# Patient Record
Sex: Female | Born: 1955 | Race: White | Hispanic: No | Marital: Married | Smoking: Former smoker
Health system: Southern US, Community
[De-identification: ages and names within clinical notes are randomized; demographics above are authoritative.]

## PROBLEM LIST (undated history)

## (undated) DIAGNOSIS — Z973 Presence of spectacles and contact lenses: Secondary | ICD-10-CM

## (undated) DIAGNOSIS — Z8249 Family history of ischemic heart disease and other diseases of the circulatory system: Secondary | ICD-10-CM

## (undated) DIAGNOSIS — E559 Vitamin D deficiency, unspecified: Secondary | ICD-10-CM

## (undated) DIAGNOSIS — R1310 Dysphagia, unspecified: Secondary | ICD-10-CM

## (undated) DIAGNOSIS — Z6826 Body mass index (BMI) 26.0-26.9, adult: Secondary | ICD-10-CM

## (undated) DIAGNOSIS — M1990 Unspecified osteoarthritis, unspecified site: Secondary | ICD-10-CM

## (undated) DIAGNOSIS — M797 Fibromyalgia: Secondary | ICD-10-CM

## (undated) DIAGNOSIS — G4700 Insomnia, unspecified: Secondary | ICD-10-CM

## (undated) DIAGNOSIS — R42 Dizziness and giddiness: Secondary | ICD-10-CM

## (undated) DIAGNOSIS — E785 Hyperlipidemia, unspecified: Secondary | ICD-10-CM

## (undated) DIAGNOSIS — R0981 Nasal congestion: Secondary | ICD-10-CM

## (undated) DIAGNOSIS — Z853 Personal history of malignant neoplasm of breast: Secondary | ICD-10-CM

## (undated) DIAGNOSIS — E7800 Pure hypercholesterolemia, unspecified: Secondary | ICD-10-CM

## (undated) DIAGNOSIS — R5383 Other fatigue: Secondary | ICD-10-CM

## (undated) DIAGNOSIS — R413 Other amnesia: Secondary | ICD-10-CM

## (undated) DIAGNOSIS — C801 Malignant (primary) neoplasm, unspecified: Secondary | ICD-10-CM

## (undated) HISTORY — DX: Nasal congestion: R09.81

## (undated) HISTORY — DX: Family history of ischemic heart disease and other diseases of the circulatory system: Z82.49

## (undated) HISTORY — PX: ABDOMINAL HYSTERECTOMY: SHX81

## (undated) HISTORY — DX: Hyperlipidemia, unspecified: E78.5

## (undated) HISTORY — DX: Dysphagia, unspecified: R13.10

## (undated) HISTORY — DX: Personal history of malignant neoplasm of breast: Z85.3

## (undated) HISTORY — PX: COLONOSCOPY: SHX174

## (undated) HISTORY — DX: Dizziness and giddiness: R42

## (undated) HISTORY — DX: Vitamin D deficiency, unspecified: E55.9

## (undated) HISTORY — DX: Unspecified osteoarthritis, unspecified site: M19.90

## (undated) HISTORY — DX: Pure hypercholesterolemia, unspecified: E78.00

## (undated) HISTORY — DX: Insomnia, unspecified: G47.00

## (undated) HISTORY — DX: Body mass index (BMI) 26.0-26.9, adult: Z68.26

## (undated) HISTORY — DX: Other amnesia: R41.3

## (undated) HISTORY — DX: Other fatigue: R53.83

---

## 1986-10-03 HISTORY — PX: ECTOPIC PREGNANCY SURGERY: SHX613

## 1998-03-08 ENCOUNTER — Other Ambulatory Visit: Admission: RE | Admit: 1998-03-08 | Discharge: 1998-03-08 | Payer: Self-pay | Admitting: Obstetrics and Gynecology

## 1999-03-09 ENCOUNTER — Other Ambulatory Visit: Admission: RE | Admit: 1999-03-09 | Discharge: 1999-03-09 | Payer: Self-pay | Admitting: Obstetrics and Gynecology

## 2005-03-04 DIAGNOSIS — Z853 Personal history of malignant neoplasm of breast: Secondary | ICD-10-CM

## 2005-03-04 HISTORY — DX: Personal history of malignant neoplasm of breast: Z85.3

## 2005-10-02 HISTORY — PX: BREAST LUMPECTOMY: SHX2

## 2005-10-18 ENCOUNTER — Encounter: Admission: RE | Admit: 2005-10-18 | Discharge: 2005-10-18 | Payer: Self-pay | Admitting: Unknown Physician Specialty

## 2005-10-18 ENCOUNTER — Encounter (INDEPENDENT_AMBULATORY_CARE_PROVIDER_SITE_OTHER): Payer: Self-pay | Admitting: Specialist

## 2005-10-18 ENCOUNTER — Encounter (INDEPENDENT_AMBULATORY_CARE_PROVIDER_SITE_OTHER): Payer: Self-pay | Admitting: Diagnostic Radiology

## 2005-10-27 ENCOUNTER — Encounter: Admission: RE | Admit: 2005-10-27 | Discharge: 2005-10-27 | Payer: Self-pay | Admitting: Unknown Physician Specialty

## 2005-10-29 ENCOUNTER — Encounter: Admission: RE | Admit: 2005-10-29 | Discharge: 2005-10-29 | Payer: Self-pay | Admitting: General Surgery

## 2005-10-30 ENCOUNTER — Encounter (INDEPENDENT_AMBULATORY_CARE_PROVIDER_SITE_OTHER): Payer: Self-pay | Admitting: Specialist

## 2005-10-30 ENCOUNTER — Encounter: Admission: RE | Admit: 2005-10-30 | Discharge: 2005-10-30 | Payer: Self-pay | Admitting: General Surgery

## 2005-10-30 ENCOUNTER — Ambulatory Visit (HOSPITAL_BASED_OUTPATIENT_CLINIC_OR_DEPARTMENT_OTHER): Admission: RE | Admit: 2005-10-30 | Discharge: 2005-10-30 | Payer: Self-pay | Admitting: General Surgery

## 2005-11-26 ENCOUNTER — Ambulatory Visit: Admission: RE | Admit: 2005-11-26 | Discharge: 2006-01-30 | Payer: Self-pay | Admitting: Radiation Oncology

## 2007-02-06 IMAGING — CR DG CHEST 2V
2 series · 2 of 2 positions shown · non-contrast
Comparison: none

CLINICAL DATA: Pre-op for right breast carcinoma surgery.
 CHEST ? 2 VIEW:

[w chest pa]
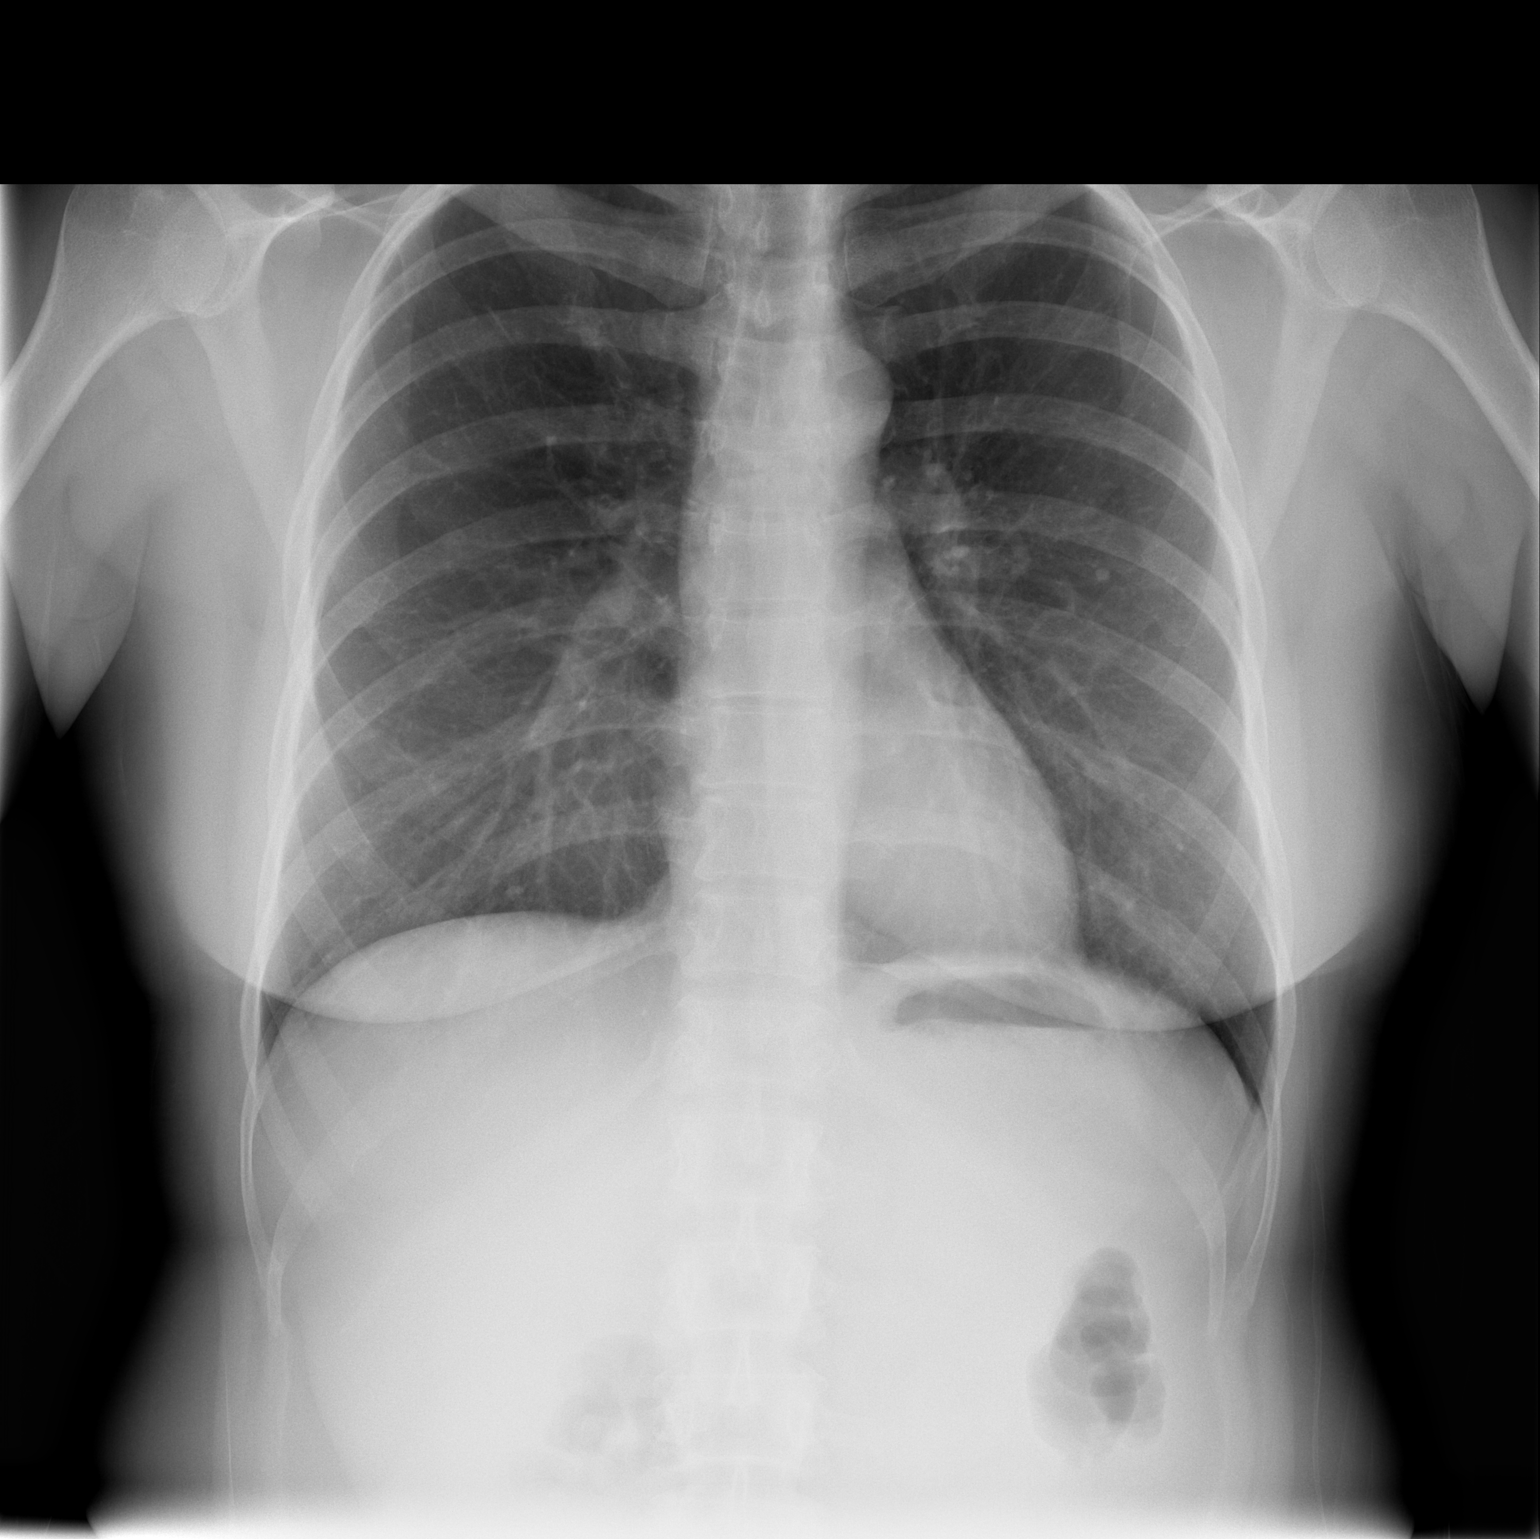

[w chest lat]
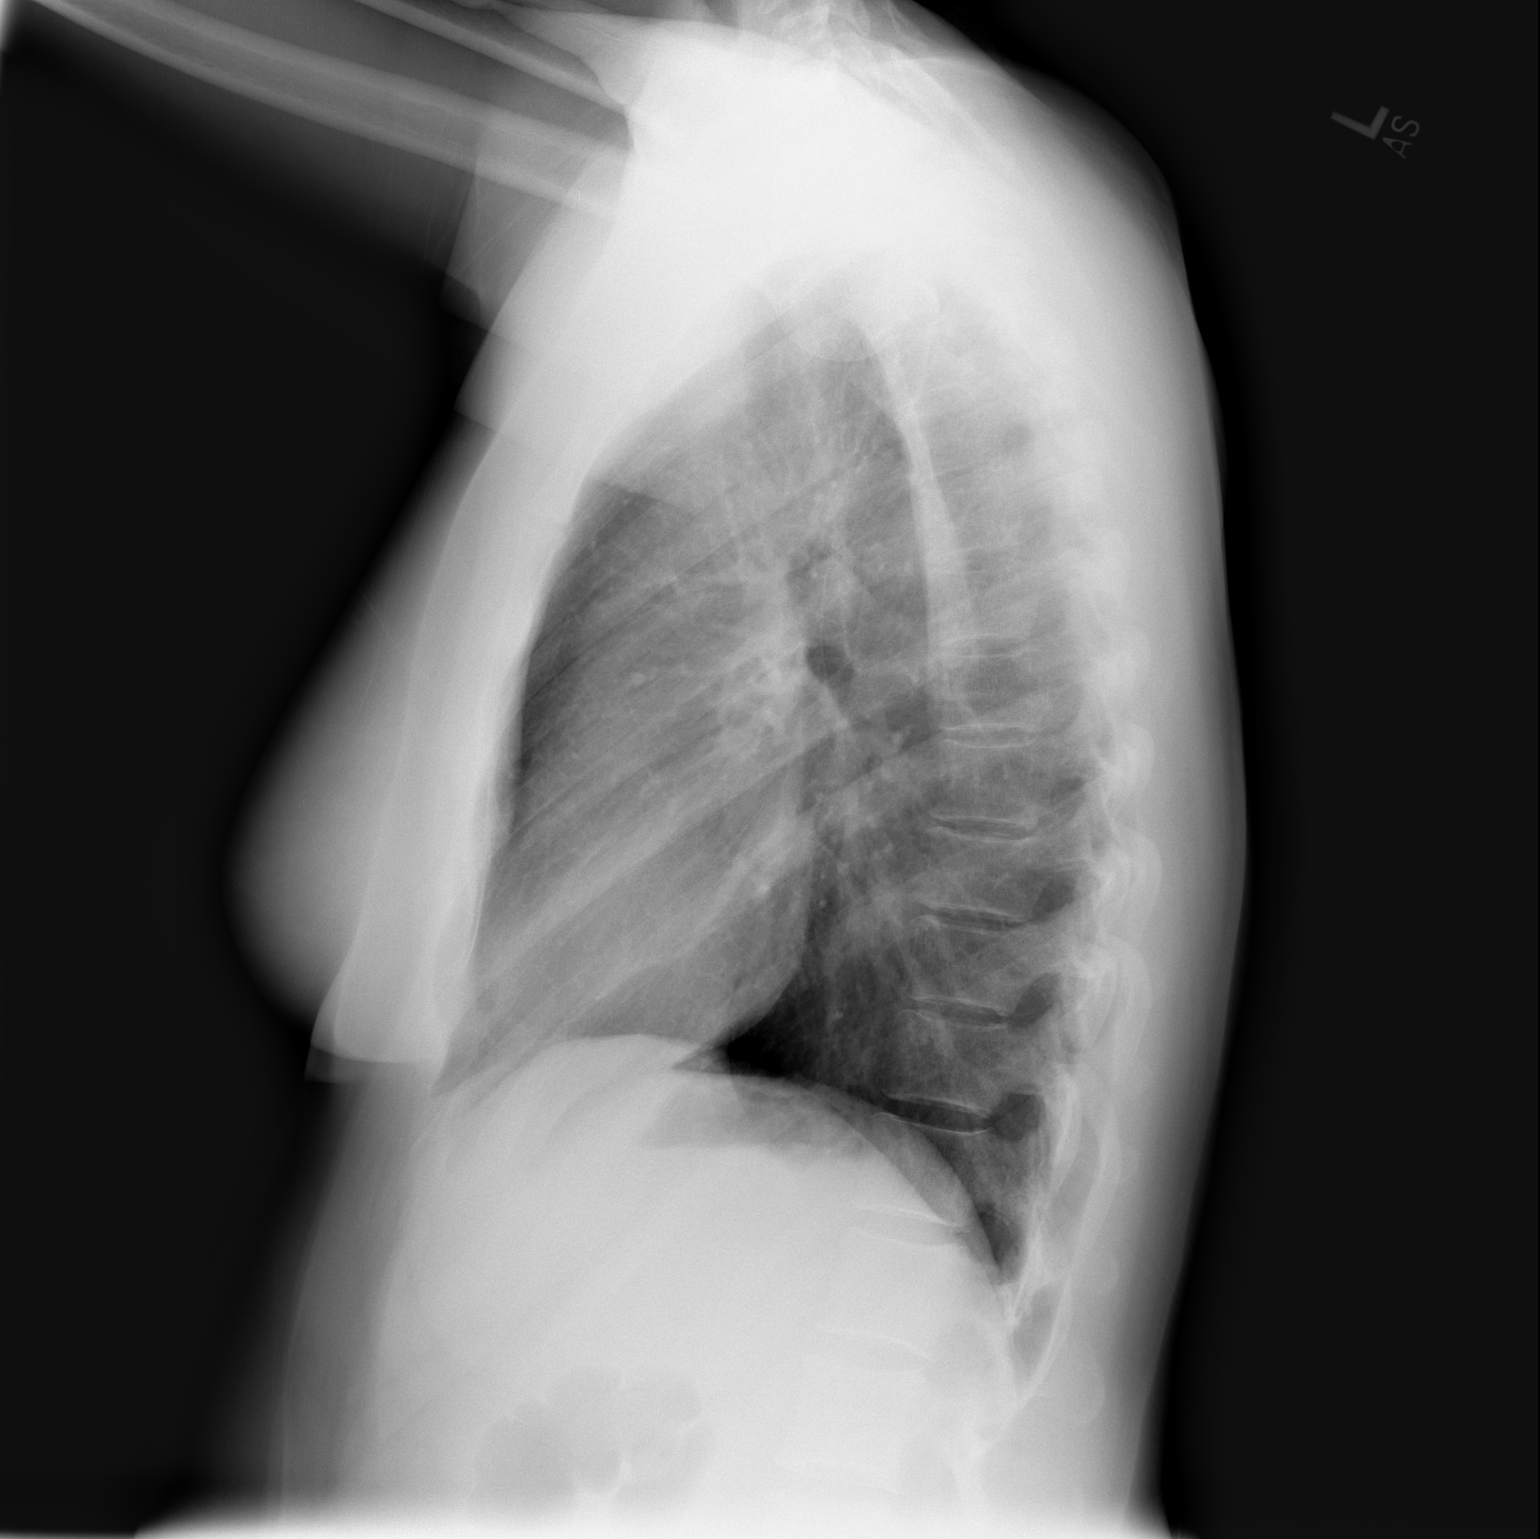

[2 of 2 positions shown; findings below may reference images not displayed]

FINDINGS: Two views of the chest show no active infiltrate or effusion.  There is a probable small granuloma within the anterior left upper lobe.  The heart is within normal limits in size.  No bony abnormality is seen.
IMPRESSION: No active lung disease.

## 2007-11-15 ENCOUNTER — Encounter: Admission: RE | Admit: 2007-11-15 | Discharge: 2007-11-15 | Payer: Self-pay | Admitting: Hematology and Oncology

## 2008-10-02 HISTORY — PX: LAPAROSCOPIC CHOLECYSTECTOMY: SUR755

## 2010-07-20 NOTE — Op Note (Signed)
NAME:  Alexandra Villa, Alexandra Villa               ACCOUNT NO.:  [id number]   MEDICAL RECORD NO.:  [id number]          PATIENT TYPE:  AMB   LOCATION:  DSC                          FACILITY:  [facility]   PHYSICIAN:  Rose Phi. Maple Hudson, M.D.   DATE OF BIRTH:  [birth year]   DATE OF PROCEDURE:  10/30/2005  DATE OF DISCHARGE:                                 OPERATIVE REPORT   PREOPERATIVE DIAGNOSIS:  Stage I carcinoma of the right breast.   POSTOPERATIVE DIAGNOSIS:  Stage I carcinoma of the right breast.   OPERATIONS:  1. Blue dye injection.  2. Right partial mastectomy with needle localization and specimen      mammogram.  3. Right sentinel lymph node biopsy.   SURGEON:  Dr. Francina Ames.   ANESTHESIA:  General.   OPERATIVE PROCEDURE:  Prior to coming the operating room, a localizing wire  had been placed in the right breast at the location of the tumor at about  the 11:30 position, and 1 millicurie of technetium sulfa colloid was  injected intradermally.   After suitable general anesthesia was induced, the patient was placed in the  supine position with the arms extended on the arm board.  Then 5 cc of a  mixture of 2 cc of methylene blue and 3 cc of injectable saline was injected  in the subareolar tissue and the breast gently massaged.  We then prepped  and draped in the standard fashion.   The localizing wire was at about the 11:30 position and a curved incision  was then outlined using that as a marker.  The incision was made and a wide  excision of the wire and surrounding tissue was carried out.  Specimen was  submitted then to the radiologist for a specimen mammogram and then to the  pathologist for evaluation of the margins.   While that was being done, a short transverse right axillary incision was  made with dissection through the subcutaneous tissue to the clavipectoral  fascia.  Deep to the fascia was a cluster of two hot and bluish lymph nodes  which we excised as sentinel nodes and  submitted those to the pathologist.  There were no other blue palpable or hot nodes.   Both incisions were then closed after injecting with 0.25% Marcaine.  We  then awaited the reports.  The specimen mammogram confirmed the removal of a  lesion.  Evaluation of the margins was considered negative, and the sentinel  node was also negative.  After applying Steri-Strips to the incisions,  dressings were then applied and the patient transferred to the recovery room  in satisfactory condition, having tolerated the procedure well.      Rose Phi. Maple Hudson, M.D.  Electronically Signed     PRY/MEDQ  D:  10/30/2005  T:  10/30/2005  Job:  [id number]

## 2011-05-20 ENCOUNTER — Encounter (INDEPENDENT_AMBULATORY_CARE_PROVIDER_SITE_OTHER): Payer: Self-pay | Admitting: General Surgery

## 2011-07-17 ENCOUNTER — Ambulatory Visit (INDEPENDENT_AMBULATORY_CARE_PROVIDER_SITE_OTHER): Payer: Self-pay | Admitting: General Surgery

## 2011-07-18 ENCOUNTER — Encounter (INDEPENDENT_AMBULATORY_CARE_PROVIDER_SITE_OTHER): Payer: Self-pay | Admitting: General Surgery

## 2011-07-18 ENCOUNTER — Ambulatory Visit (INDEPENDENT_AMBULATORY_CARE_PROVIDER_SITE_OTHER): Payer: BC Managed Care – PPO | Admitting: General Surgery

## 2011-07-18 VITALS — BP 122/90 | HR 72 | Temp 96.8°F | Resp 16 | Ht 62.5 in | Wt 145.0 lb

## 2011-07-18 DIAGNOSIS — Z853 Personal history of malignant neoplasm of breast: Secondary | ICD-10-CM

## 2011-07-18 NOTE — Progress Notes (Signed)
Chief complaint: Followup breast cancer History: Patient returns to the office for routine followup for breast cancer with a history of lumpectomy, negative sentinel lymph node biopsy, radiation therapy, and 4 years of adjuvant tamoxifen for T1 B. N0 carcinoma right breast. She is over 5 years post treatment. She has some chronic aches and pains secondary to fibromyalgia. No change in her breast self exam. She has some occasional twinges of pain in her right arm that are stable. No arm swelling. No skin changes or nipple discharge. She had her mammogram in August that she reports as negative although we do not have a copy.  Exam: General: Appears well Lymph nodes: No cervical, supraclavicular, or axillary nodes palpable Breasts: Well-healed lumpectomy site in the upper right breast. There is a very slight thickening beneath the axillary incision and beneath the lumpectomy site but no mass. No skin changes. No nipple inversion. Left breast is negative.  Assessment and plan: Doing well 5 years following treatment for right breast cancer with no evidence of recurrent or new cancer. I encouraged to continue yearly screening mammograms and once a year physical exam with Dr. Lawana Pai for her primary physician and we will be available as needed.

## 2011-07-26 ENCOUNTER — Ambulatory Visit (INDEPENDENT_AMBULATORY_CARE_PROVIDER_SITE_OTHER): Payer: Self-pay | Admitting: General Surgery

## 2011-08-08 ENCOUNTER — Telehealth (INDEPENDENT_AMBULATORY_CARE_PROVIDER_SITE_OTHER): Payer: Self-pay

## 2011-08-08 NOTE — Telephone Encounter (Signed)
Request for 2012-2013 records faxed to [facility] [phone].

## 2011-10-01 ENCOUNTER — Encounter: Payer: Self-pay | Admitting: Hematology and Oncology

## 2011-10-01 DIAGNOSIS — N63 Unspecified lump in unspecified breast: Secondary | ICD-10-CM

## 2011-10-01 DIAGNOSIS — Z853 Personal history of malignant neoplasm of breast: Secondary | ICD-10-CM

## 2011-10-14 ENCOUNTER — Encounter (INDEPENDENT_AMBULATORY_CARE_PROVIDER_SITE_OTHER): Payer: Self-pay

## 2011-10-21 DIAGNOSIS — C50919 Malignant neoplasm of unspecified site of unspecified female breast: Secondary | ICD-10-CM

## 2012-07-22 ENCOUNTER — Ambulatory Visit (INDEPENDENT_AMBULATORY_CARE_PROVIDER_SITE_OTHER): Payer: BC Managed Care – PPO | Admitting: General Surgery

## 2012-10-05 ENCOUNTER — Encounter (INDEPENDENT_AMBULATORY_CARE_PROVIDER_SITE_OTHER): Payer: Self-pay

## 2012-10-29 ENCOUNTER — Encounter (INDEPENDENT_AMBULATORY_CARE_PROVIDER_SITE_OTHER): Payer: Self-pay | Admitting: General Surgery

## 2012-10-29 ENCOUNTER — Ambulatory Visit (INDEPENDENT_AMBULATORY_CARE_PROVIDER_SITE_OTHER): Payer: BC Managed Care – PPO | Admitting: General Surgery

## 2012-10-29 VITALS — BP 128/80 | HR 58 | Resp 16 | Ht 62.5 in | Wt 134.6 lb

## 2012-10-29 DIAGNOSIS — Z853 Personal history of malignant neoplasm of breast: Secondary | ICD-10-CM

## 2012-10-29 DIAGNOSIS — C50911 Malignant neoplasm of unspecified site of right female breast: Secondary | ICD-10-CM | POA: Insufficient documentation

## 2012-10-29 NOTE — Progress Notes (Signed)
Chief complaint: Followup breast cancer History: Patient returns to the office for  followup for breast cancer with a history of lumpectomy, negative sentinel lymph node biopsy, radiation therapy, and 4 years of adjuvant tamoxifen for T1 B. N0 carcinoma right breast. She is over 5 years post treatment.She is a little concerned about 2 small pigmented skin lesions she is noted on the lateral aspect of her right treated breast. No change in her breast self exam. She also has noted for about 2 months of change in her bowel habits with some intermittent constipation or frequent stools. No blood. No pain. Her last colonoscopy was 9 years ago.  Exam: General: Appears well Lymph nodes: No cervical, supraclavicular, or axillary nodes palpable Skin: On the lateral aspect of the right breast are 2 very small approximately 2 mm slightly raised discrete pigmented skin lesions Breasts: Well-healed lumpectomy site in the upper right breast. There is a very slight thickening beneath the axillary incision and beneath the lumpectomy site but no mass. No skin changes. No nipple inversion. Left breast is negative.  Assessment and plan: Doing well 5 years following treatment for right breast cancer with no evidence of recurrent or new cancer. The skin lesions on her breast appeared to be small benign keratoses and I recommend observation only. She does have some change in her bowel habits. I recommended initially fiber supplementation and since she has a personal history of breast cancer in her last colonoscopy was 9 years ago I recommended that she proceed with screening colonoscopy. She will contact her primary physician and he didn't have this arranged.

## 2012-10-29 NOTE — Patient Instructions (Addendum)
Take a fiber supplement daily such as Metamucil or FiberCon or Surfak or others Would go ahead and have a colonoscopy

## 2013-01-07 ENCOUNTER — Other Ambulatory Visit: Payer: Self-pay

## 2013-01-08 ENCOUNTER — Encounter (INDEPENDENT_AMBULATORY_CARE_PROVIDER_SITE_OTHER): Payer: Self-pay

## 2013-01-25 ENCOUNTER — Encounter (INDEPENDENT_AMBULATORY_CARE_PROVIDER_SITE_OTHER): Payer: Self-pay | Admitting: General Surgery

## 2013-02-11 ENCOUNTER — Encounter (INDEPENDENT_AMBULATORY_CARE_PROVIDER_SITE_OTHER): Payer: Self-pay

## 2013-03-05 ENCOUNTER — Encounter (INDEPENDENT_AMBULATORY_CARE_PROVIDER_SITE_OTHER): Payer: BC Managed Care – PPO | Admitting: General Surgery

## 2013-03-08 ENCOUNTER — Encounter (INDEPENDENT_AMBULATORY_CARE_PROVIDER_SITE_OTHER): Payer: Self-pay

## 2013-03-09 ENCOUNTER — Encounter (INDEPENDENT_AMBULATORY_CARE_PROVIDER_SITE_OTHER): Payer: Self-pay

## 2014-03-17 ENCOUNTER — Other Ambulatory Visit: Payer: Self-pay | Admitting: Orthopedic Surgery

## 2014-04-12 ENCOUNTER — Encounter (HOSPITAL_BASED_OUTPATIENT_CLINIC_OR_DEPARTMENT_OTHER): Payer: Self-pay | Admitting: *Deleted

## 2014-04-12 NOTE — Progress Notes (Signed)
No labs needed

## 2014-04-14 ENCOUNTER — Ambulatory Visit (HOSPITAL_BASED_OUTPATIENT_CLINIC_OR_DEPARTMENT_OTHER)
Admission: RE | Admit: 2014-04-14 | Discharge: 2014-04-14 | Disposition: A | Discharge status: Home / Self Care | Payer: BLUE CROSS/BLUE SHIELD | Source: Ambulatory Visit | Attending: Orthopedic Surgery | Admitting: Orthopedic Surgery

## 2014-04-14 ENCOUNTER — Encounter (HOSPITAL_BASED_OUTPATIENT_CLINIC_OR_DEPARTMENT_OTHER): Payer: Self-pay | Admitting: Anesthesiology

## 2014-04-14 ENCOUNTER — Ambulatory Visit (HOSPITAL_BASED_OUTPATIENT_CLINIC_OR_DEPARTMENT_OTHER): Payer: BLUE CROSS/BLUE SHIELD | Admitting: Anesthesiology

## 2014-04-14 ENCOUNTER — Encounter (HOSPITAL_BASED_OUTPATIENT_CLINIC_OR_DEPARTMENT_OTHER)
Admission: RE | Disposition: A | Discharge status: Home / Self Care | Payer: Self-pay | Source: Ambulatory Visit | Attending: Orthopedic Surgery

## 2014-04-14 DIAGNOSIS — Z791 Long term (current) use of non-steroidal anti-inflammatories (NSAID): Secondary | ICD-10-CM | POA: Diagnosis not present

## 2014-04-14 DIAGNOSIS — G5601 Carpal tunnel syndrome, right upper limb: Secondary | ICD-10-CM | POA: Insufficient documentation

## 2014-04-14 DIAGNOSIS — Z87891 Personal history of nicotine dependence: Secondary | ICD-10-CM | POA: Diagnosis not present

## 2014-04-14 DIAGNOSIS — Z8261 Family history of arthritis: Secondary | ICD-10-CM | POA: Diagnosis not present

## 2014-04-14 DIAGNOSIS — R200 Anesthesia of skin: Secondary | ICD-10-CM | POA: Diagnosis present

## 2014-04-14 DIAGNOSIS — M1990 Unspecified osteoarthritis, unspecified site: Secondary | ICD-10-CM | POA: Insufficient documentation

## 2014-04-14 DIAGNOSIS — Z853 Personal history of malignant neoplasm of breast: Secondary | ICD-10-CM | POA: Insufficient documentation

## 2014-04-14 DIAGNOSIS — Z79899 Other long term (current) drug therapy: Secondary | ICD-10-CM | POA: Insufficient documentation

## 2014-04-14 DIAGNOSIS — G5602 Carpal tunnel syndrome, left upper limb: Secondary | ICD-10-CM | POA: Diagnosis not present

## 2014-04-14 HISTORY — PX: CARPAL TUNNEL RELEASE: SHX101

## 2014-04-14 HISTORY — DX: Presence of spectacles and contact lenses: Z97.3

## 2014-04-14 LAB — POCT HEMOGLOBIN-HEMACUE: Hemoglobin: 14.2 g/dL (ref 12.0–15.0)

## 2014-04-14 SURGERY — CARPAL TUNNEL RELEASE
Anesthesia: Monitor Anesthesia Care | Site: Wrist | Laterality: Right

## 2014-04-14 MED ORDER — DEXTROSE 50 % IV SOLN: 25.0000 mL | Freq: Once | INTRAVENOUS | Status: DC

## 2014-04-14 MED ORDER — CHLORHEXIDINE GLUCONATE 4 % EX LIQD
60.0000 mL | Freq: Once | CUTANEOUS | Status: DC
Start: 2014-04-14 — End: 2014-04-14

## 2014-04-14 MED ORDER — LIDOCAINE HCL (PF) 0.5 % IJ SOLN
INTRAMUSCULAR | Status: DC | PRN
  Administered 2014-04-14: 30 mL via INTRAVENOUS

## 2014-04-14 MED ORDER — CHLORHEXIDINE GLUCONATE 4 % EX LIQD: 60.0000 mL | Freq: Once | CUTANEOUS | Status: DC

## 2014-04-14 MED ORDER — PROPOFOL 10 MG/ML IV BOLUS
INTRAVENOUS | Status: DC | PRN
  Administered 2014-04-14: 20 mg via INTRAVENOUS

## 2014-04-14 MED ORDER — CEFAZOLIN SODIUM-DEXTROSE 2-3 GM-% IV SOLR: 2.0000 g | INTRAVENOUS | Status: DC

## 2014-04-14 MED ORDER — FENTANYL CITRATE 0.05 MG/ML IJ SOLN
INTRAMUSCULAR | Status: DC | PRN
  Administered 2014-04-14: 100 ug via INTRAVENOUS

## 2014-04-14 MED ORDER — BUPIVACAINE HCL (PF) 0.25 % IJ SOLN
INTRAMUSCULAR | Status: AC
  Filled 2014-04-14: qty 150

## 2014-04-14 MED ORDER — CEFAZOLIN SODIUM-DEXTROSE 2-3 GM-% IV SOLR
INTRAVENOUS | Status: AC
  Filled 2014-04-14: qty 50

## 2014-04-14 MED ORDER — CEFAZOLIN SODIUM-DEXTROSE 2-3 GM-% IV SOLR
2.0000 g | INTRAVENOUS | Status: AC
  Administered 2014-04-14: 2 g via INTRAVENOUS

## 2014-04-14 MED ORDER — MIDAZOLAM HCL 5 MG/5ML IJ SOLN
INTRAMUSCULAR | Status: DC | PRN
  Administered 2014-04-14: 2 mg via INTRAVENOUS

## 2014-04-14 MED ORDER — MIDAZOLAM HCL 2 MG/2ML IJ SOLN: 1.0000 mg | INTRAMUSCULAR | Status: DC | PRN

## 2014-04-14 MED ORDER — LIDOCAINE HCL (PF) 1 % IJ SOLN
INTRAMUSCULAR | Status: AC
  Filled 2014-04-14: qty 90

## 2014-04-14 MED ORDER — LACTATED RINGERS IV SOLN
INTRAVENOUS | Status: DC
  Administered 2014-04-14: 08:00:00 via INTRAVENOUS

## 2014-04-14 MED ORDER — PROPOFOL INFUSION 10 MG/ML OPTIME
INTRAVENOUS | Status: DC | PRN
  Administered 2014-04-14: 100 ug/kg/min via INTRAVENOUS

## 2014-04-14 MED ORDER — FENTANYL CITRATE 0.05 MG/ML IJ SOLN: 50.0000 ug | INTRAMUSCULAR | Status: DC | PRN

## 2014-04-14 MED ORDER — FENTANYL CITRATE 0.05 MG/ML IJ SOLN
INTRAMUSCULAR | Status: AC
  Filled 2014-04-14: qty 2

## 2014-04-14 MED ORDER — ONDANSETRON HCL 4 MG/2ML IJ SOLN
INTRAMUSCULAR | Status: DC | PRN
  Administered 2014-04-14: 4 mg via INTRAVENOUS

## 2014-04-14 MED ORDER — BUPIVACAINE HCL (PF) 0.25 % IJ SOLN
INTRAMUSCULAR | Status: DC | PRN
  Administered 2014-04-14: 5 mL

## 2014-04-14 MED ORDER — TRAMADOL HCL 50 MG PO TABS: 50.0000 mg | ORAL_TABLET | Freq: Four times a day (QID) | ORAL | Status: DC | PRN

## 2014-04-14 MED ORDER — MIDAZOLAM HCL 2 MG/2ML IJ SOLN
INTRAMUSCULAR | Status: AC
  Filled 2014-04-14: qty 2

## 2014-04-14 SURGICAL SUPPLY — 38 items
BLADE SURG 15 STRL LF DISP TIS (BLADE) ×1 IMPLANT
BLADE SURG 15 STRL SS (BLADE) ×3
BNDG COHESIVE 3X5 TAN STRL LF (GAUZE/BANDAGES/DRESSINGS) ×3 IMPLANT
BNDG ESMARK 4X9 LF (GAUZE/BANDAGES/DRESSINGS) ×3 IMPLANT
BNDG GAUZE ELAST 4 BULKY (GAUZE/BANDAGES/DRESSINGS) ×3 IMPLANT
CHLORAPREP W/TINT 26ML (MISCELLANEOUS) ×3 IMPLANT
CORDS BIPOLAR (ELECTRODE) ×3 IMPLANT
COVER BACK TABLE 60X90IN (DRAPES) ×3 IMPLANT
COVER MAYO STAND STRL (DRAPES) ×3 IMPLANT
CUFF TOURNIQUET SINGLE 18IN (TOURNIQUET CUFF) ×3 IMPLANT
DRAPE EXTREMITY T 121X128X90 (DRAPE) ×3 IMPLANT
DRAPE SURG 17X23 STRL (DRAPES) ×3 IMPLANT
DRSG PAD ABDOMINAL 8X10 ST (GAUZE/BANDAGES/DRESSINGS) ×3 IMPLANT
GAUZE SPONGE 4X4 12PLY STRL (GAUZE/BANDAGES/DRESSINGS) ×3 IMPLANT
GAUZE XEROFORM 1X8 LF (GAUZE/BANDAGES/DRESSINGS) ×3 IMPLANT
GLOVE BIOGEL M STRL SZ7.5 (GLOVE) ×3 IMPLANT
GLOVE BIOGEL PI IND STRL 7.5 (GLOVE) ×1 IMPLANT
GLOVE BIOGEL PI IND STRL 8 (GLOVE) ×1 IMPLANT
GLOVE BIOGEL PI IND STRL 8.5 (GLOVE) ×1 IMPLANT
GLOVE BIOGEL PI INDICATOR 7.5 (GLOVE) ×2
GLOVE BIOGEL PI INDICATOR 8 (GLOVE) ×2
GLOVE BIOGEL PI INDICATOR 8.5 (GLOVE) ×2
GLOVE SURG ORTHO 8.0 STRL STRW (GLOVE) ×3 IMPLANT
GLOVE SURG SS PI 7.0 STRL IVOR (GLOVE) ×3 IMPLANT
GOWN STRL REUS W/ TWL LRG LVL3 (GOWN DISPOSABLE) ×1 IMPLANT
GOWN STRL REUS W/ TWL XL LVL3 (GOWN DISPOSABLE) ×1 IMPLANT
GOWN STRL REUS W/TWL LRG LVL3 (GOWN DISPOSABLE) ×2
GOWN STRL REUS W/TWL XL LVL3 (GOWN DISPOSABLE) ×6 IMPLANT
NEEDLE PRECISIONGLIDE 27X1.5 (NEEDLE) ×3 IMPLANT
NS IRRIG 1000ML POUR BTL (IV SOLUTION) ×3 IMPLANT
PACK BASIN DAY SURGERY FS (CUSTOM PROCEDURE TRAY) ×3 IMPLANT
STOCKINETTE 4X48 STRL (DRAPES) ×3 IMPLANT
SUT ETHILON 4 0 PS 2 18 (SUTURE) ×3 IMPLANT
SUT VICRYL 4-0 PS2 18IN ABS (SUTURE) IMPLANT
SYR BULB 3OZ (MISCELLANEOUS) ×3 IMPLANT
SYR CONTROL 10ML LL (SYRINGE) ×3 IMPLANT
TOWEL OR 17X24 6PK STRL BLUE (TOWEL DISPOSABLE) ×3 IMPLANT
UNDERPAD 30X30 INCONTINENT (UNDERPADS AND DIAPERS) ×3 IMPLANT

## 2014-04-14 NOTE — Discharge Instructions (Addendum)
[facility] Instructions °Hand Surgery ° °Wound Care: °Keep your hand elevated above the level of your heart.  Do not allow it to dangle by your side.  Keep the dressing dry and do not remove it unless your doctor advises you to do so.  He will usually change it at the time of your post-op visit.  Moving your fingers is advised to stimulate circulation but will depend on the site of your surgery.  If you have a splint applied, your doctor will advise you regarding movement. ° °Activity: °Do not drive or operate machinery today.  Rest today and then you may return to your normal activity and work as indicated by your physician. ° °Diet:  °Drink liquids today or eat a light diet.  You may resume a regular diet tomorrow.   ° °General expectations: °Pain for two to three days. °Fingers may become slightly swollen. ° °Call your doctor if any of the following occur: °Severe pain not relieved by pain medication. °Elevated temperature. °Dressing soaked with blood. °Inability to move fingers. °Oyervides or bluish color to fingers. ° ° °Post Anesthesia Home Care Instructions ° °Activity: °Get plenty of rest for the remainder of the day. A responsible adult should stay with you for 24 hours following the procedure.  °For the next 24 hours, DO NOT: °-Drive a car °-Operate machinery °-Drink alcoholic beverages °-Take any medication unless instructed by your physician °-Make any legal decisions or sign important papers. ° °Meals: °Start with liquid foods such as gelatin or soup. Progress to regular foods as tolerated. Avoid greasy, spicy, heavy foods. If nausea and/or vomiting occur, drink only clear liquids until the nausea and/or vomiting subsides. Call your physician if vomiting continues. ° °Special Instructions/Symptoms: °Your throat may feel dry or sore from the anesthesia or the breathing tube placed in your throat during surgery. If this causes discomfort, gargle with warm salt water. The discomfort should disappear within 24  hours. ° °

## 2014-04-14 NOTE — Op Note (Signed)
[name], Alexandra Villa               ACCOUNT NO.:  [id number]  MEDICAL RECORD NO.:  [id number]  LOCATION:                                 FACILITY:  PHYSICIAN:  Daryll Brod, M.D.            DATE OF BIRTH:  DATE OF PROCEDURE:  04/14/2014 DATE OF DISCHARGE:                              OPERATIVE REPORT   PREOPERATIVE DIAGNOSIS:  Carpal tunnel syndrome, right hand.  POSTOPERATIVE DIAGNOSIS:  Carpal tunnel syndrome, right hand.  OPERATION:  Decompression of right median nerve.  SURGEON:  Daryll Brod, MD  ANESTHESIA:  Forearm-based IV regional with local infiltration.  ANESTHESIOLOGIST:  Finis Bud, M.D.  HISTORY:  The patient is a 59 year old female with a history of bilateral carpal tunnel syndrome.  Nerve conduction is positive.  She has elected to undergo surgical release with failure of conservative treatment.  Pre, peri, and postoperative course have been discussed along with risks and complications.  She is aware that there is no guarantee with surgery, possibility of infection; recurrence of injury to arteries, nerves, tendons, incomplete relief of symptoms; dystrophy. In preoperative area, the patient was seen, the extremity marked by both patient and surgeon.  Antibiotic given.  PROCEDURE IN DETAIL:  The patient was brought to the operating room, where a forearm-based IV regional anesthetic was carried out without difficulty.  She was prepped using ChloraPrep, supine position, right arm free.  A 3-minute dry time was allowed.  Time-out taken, confirming patient and procedure.  After adequate anesthesia was afforded, a longitudinal incision was made in the right palm, carried down through subcutaneous tissue.  Bleeders were electrocauterized with bipolar. Palmar fascia was split.  Superficial palmar arch identified.  Flexor tendon to the ring little finger identified to the ulnar side of median nerve.  Carpal retinaculum was incised with sharp dissection.   Right angle and Sewall retractor were placed between skin and forearm fascia. The fascia was released for approximately 2 cm proximal to the wrist crease under direct vision.  Canal was explored.  Thenar musculature was noted to enter into muscle.  Area of compression of the nerve was apparent with an hourglass deformity.  No further lesions were identified.  The wound was copiously irrigated with saline and the skin closed with interrupted 4-0 nylon sutures.  Local infiltration with 0.25% bupivacaine without epinephrine was given, approximately 6 mL was used.  A sterile compressive dressing with the fingers free was applied.  On deflation of the tourniquet, all fingers immediately pinked.  She was taken to the recovery room for observation in satisfactory condition.  She will be discharged home to return to the [facility] in 1 week on tramadol.          ______________________________ Daryll Brod, M.D.     GK/MEDQ  D:  04/14/2014  T:  04/14/2014  Job:  [id number]

## 2014-04-14 NOTE — Brief Op Note (Signed)
04/14/2014  9:09 AM  PATIENT:  Alexandra Villa  59 y.o. female  PRE-OPERATIVE DIAGNOSIS:  RIGHT CARPAL TUNNEL SYNDROME   POST-OPERATIVE DIAGNOSIS:  right carpal tunnel syndrome  PROCEDURE:  Procedure(s) with comments: RIGHT CARPAL TUNNEL RELEASE (Right) - ANESTHESIA: IV REGIONAL FOREARM BLOCK  SURGEON:  Surgeon(s) and Role:    * Daryll Brod, MD - Primary  PHYSICIAN ASSISTANT:   ASSISTANTS: none   ANESTHESIA:   local and regional  EBL:  Total I/O In: 600 [I.V.:600] Out: -   BLOOD ADMINISTERED:none  DRAINS: none   LOCAL MEDICATIONS USED:  BUPIVICAINE   SPECIMEN:  No Specimen  DISPOSITION OF SPECIMEN:  N/A  COUNTS:  YES  TOURNIQUET:   Total Tourniquet Time Documented: Forearm (Right) - 20 minutes Total: Forearm (Right) - 20 minutes   DICTATION: .Other Dictation: Dictation Number [id number]  PLAN OF CARE: Discharge to home after PACU  PATIENT DISPOSITION:  PACU - hemodynamically stable.

## 2014-04-14 NOTE — Transfer of Care (Signed)
Immediate Anesthesia Transfer of Care Note  Patient: Alexandra Villa  Procedure(s) Performed: Procedure(s) with comments: RIGHT CARPAL TUNNEL RELEASE (Right) - ANESTHESIA: IV REGIONAL FOREARM BLOCK  Patient Location: PACU  Anesthesia Type:MAC and Bier block  Level of Consciousness: awake, alert  and oriented  Airway & Oxygen Therapy: Patient Spontanous Breathing  Post-op Assessment: Report given to RN and Post -op Vital signs reviewed and stable  Post vital signs: Reviewed and stable  Last Vitals:  Filed Vitals:   04/14/14 0718  BP: 158/87  Pulse: 62  Temp: 36.5 C  Resp: 16    Complications: No apparent anesthesia complications

## 2014-04-14 NOTE — H&P (Signed)
Alexandra Villa is a 59 year old right hand dominant female referred by Dr. Rory Percy for a consultation with respect to numbness and tingling bilateral hands, right greater than left. She says this is relatively constant on her right side, slightly less on the left. This has been going on for approximately 2 years. She says it is all fingers especially at night. She is awakened 2 out of 7 nights. She has no history of injury to the hands or neck. There is no history of diabetes, thyroid problems, or gout. She does have a history of arthritis. She has been taking ibuprofen with minimal relief. She does wear a brace at night and occasionally during the day. She complains of intermittent, severe, sharp, aching type pain with a feeling of numbness and getting worse. Activity makes it worse and rest makes it better. She has a history of breast cancer. Nerve conduction tests show bilateral carpal tunnel syndrome.  PAST MEDICAL HISTORY:  She is allergic to sulfa and Cataflam. She is on fish oil, CoQ10, and Citracil. She has had a tubal pregnancy, breast cancer, cholecystectomy, and hysterectomy.  FAMILY MEDICAL HISTORY: Positive for heart disease, high BP and arthritis.  SOCIAL HISTORY:  She does not smoke or use alcohol. She is married and works at [employer] in [employer], political party.  REVIEW OF SYSTEMS: Positive for breast cancer, glasses, vertigo, otherwise negative 14 points.  Alexandra Villa is an 59 y.o. female.   Chief Complaint: Bilateral carpal tunnel syndrome HPI: see above  Past Medical History  Diagnosis Date  . Arthritis   . History of breast cancer   . Wears glasses     Past Surgical History  Procedure Laterality Date  . Ectopic pregnancy surgery  10/1986  . Laparoscopic cholecystectomy  10/2008  . Breast lumpectomy  10/2005    rt [id number]  . Abdominal hysterectomy    . Colonoscopy      Family History  Problem Relation Age of Onset  . Breast cancer    . Colon cancer     . COPD Mother   . Heart disease Father    Social History:  reports that she quit smoking about 28 years ago. She does not have any smokeless tobacco history on file. She reports that she does not drink alcohol or use illicit drugs.  Allergies:  Allergies  Allergen Reactions  . Cataflam [Diclofenac] Hives  . Hydrocodone Nausea And Vomiting  . Sulfa Antibiotics Hives    Medications Prior to Admission  Medication Sig Dispense Refill  . Coenzyme Q10 (COQ10 PO) Take by mouth daily at 2 am.    . fish oil-omega-3 fatty acids 1000 MG capsule Take 2 g by mouth daily.    Marland Kitchen loratadine (CLARITIN) 10 MG tablet Take 10 mg by mouth daily.    . psyllium (REGULOID) 0.52 G capsule Take 0.52 g by mouth daily.      No results found for this or any previous visit (from the past 48 hour(s)).  No results found.   Pertinent items are noted in HPI.  Blood pressure 158/87, pulse 62, temperature 97.7 F (36.5 C), temperature source Oral, resp. rate 16, height 5\' 2"  (1.575 m), weight 67.302 kg (148 lb 6 oz), SpO2 100 %.  General appearance: alert, cooperative and appears stated age Head: Normocephalic, without obvious abnormality Neck: no JVD Resp: clear to auscultation bilaterally Cardio: regular rate and rhythm, S1, S2 normal, no murmur, click, rub or gallop GI: soft, non-tender; bowel sounds normal; no masses,  no organomegaly Extremities: numbness right hand Pulses: 2+ and symmetric Skin: Skin color, texture, turgor normal. No rashes or lesions Neurologic: Grossly normal Incision/Wound: na  Assessment/Plan Dx; bilateral carpal tunnel syndrome.   Her right side has been injected but has continued to bother her. She would like to discuss surgical intervention of this. We have discussed the possibility of release of the right carpal canal. Pre, peri and post op care are discussed along with risks and complications. Patient is aware there is no guarantee with surgery, possibility of infection,  injury to arteries, nerves, and tendons, incomplete relief and dystrophy. She would like to proceed. She is scheduled for right carpal tunnel release as an outpatient. She is seen with her husband. Questions are encouraged and answered to the patient's satisfaction.  Daizee Firmin R 04/14/2014, 7:36 AM

## 2014-04-14 NOTE — Anesthesia Preprocedure Evaluation (Addendum)
Anesthesia Evaluation  Patient identified by MRN, date of birth, ID band Patient awake    Reviewed: Allergy & Precautions, NPO status , Patient's Chart, lab work & pertinent test results  History of Anesthesia Complications Negative for: history of anesthetic complications  Airway Mallampati: II  TM Distance: >3 FB Neck ROM: Full    Dental   Pulmonary former smoker,  breath sounds clear to auscultation        Cardiovascular negative cardio ROS  Rhythm:Regular Rate:Normal     Neuro/Psych negative neurological ROS  negative psych ROS   GI/Hepatic negative GI ROS, Neg liver ROS,   Endo/Other  negative endocrine ROS  Renal/GU negative Renal ROS     Musculoskeletal   Abdominal   Peds  Hematology   Anesthesia Other Findings   Reproductive/Obstetrics                           Anesthesia Physical Anesthesia Plan  ASA: II  Anesthesia Plan: MAC and Regional   Post-op Pain Management:    Induction: Intravenous  Airway Management Planned: Simple Face Mask  Additional Equipment:   Intra-op Plan:   Post-operative Plan:   Informed Consent: I have reviewed the patients History and Physical, chart, labs and discussed the procedure including the risks, benefits and alternatives for the proposed anesthesia with the patient or authorized representative who has indicated his/her understanding and acceptance.   Dental advisory given  Plan Discussed with: CRNA and Anesthesiologist  Anesthesia Plan Comments:        Anesthesia Quick Evaluation

## 2014-04-14 NOTE — Anesthesia Postprocedure Evaluation (Signed)
  Anesthesia Post-op Note  Patient: Alexandra Villa  Procedure(s) Performed: Procedure(s) with comments: RIGHT CARPAL TUNNEL RELEASE (Right) - ANESTHESIA: IV REGIONAL FOREARM BLOCK  Patient Location: PACU  Anesthesia Type:MAC  Level of Consciousness: awake  Airway and Oxygen Therapy: Patient Spontanous Breathing  Post-op Pain: mild  Post-op Assessment: Post-op Vital signs reviewed  Post-op Vital Signs: Reviewed  Last Vitals:  Filed Vitals:   04/14/14 0909  BP: 119/74  Pulse: 79  Temp:   Resp: 14    Complications: No apparent anesthesia complications

## 2014-04-14 NOTE — Addendum Note (Signed)
Addendum  created 04/14/14 0939 by Finis Bud, MD   Modules edited: Anesthesia Review and Sign Navigator Section, Clinical Notes   Clinical Notes:  File: [id number]

## 2014-04-14 NOTE — Op Note (Signed)
Dictation Number [id number]

## 2014-04-18 ENCOUNTER — Encounter (HOSPITAL_BASED_OUTPATIENT_CLINIC_OR_DEPARTMENT_OTHER): Payer: Self-pay | Admitting: Orthopedic Surgery

## 2021-01-23 ENCOUNTER — Telehealth: Payer: Self-pay

## 2021-01-23 NOTE — Telephone Encounter (Signed)
SCANNED NOTES TO REFERRAL RJ

## 2021-02-28 ENCOUNTER — Other Ambulatory Visit: Payer: Self-pay

## 2021-02-28 ENCOUNTER — Encounter: Payer: Self-pay | Admitting: Cardiology

## 2021-02-28 ENCOUNTER — Ambulatory Visit (INDEPENDENT_AMBULATORY_CARE_PROVIDER_SITE_OTHER): Payer: Medicare Other | Admitting: Cardiology

## 2021-02-28 VITALS — BP 114/70 | HR 78 | Ht 62.0 in | Wt 149.4 lb

## 2021-02-28 DIAGNOSIS — R002 Palpitations: Secondary | ICD-10-CM

## 2021-02-28 DIAGNOSIS — R5383 Other fatigue: Secondary | ICD-10-CM

## 2021-02-28 NOTE — Patient Instructions (Signed)
Medication Instructions:  Continue all current medications.  Labwork: none  Testing/Procedures:  Your physician has requested that you have an echocardiogram. Echocardiography is a painless test that uses sound waves to create images of your heart. It provides your doctor with information about the size and shape of your heart and how well your heart's chambers and valves are working. This procedure takes approximately one hour. There are no restrictions for this procedure.  Office will contact with results via phone or letter.    Follow-Up: Pending test results.  Any Other Special Instructions Will Be Listed Below (If Applicable).  If you need a refill on your cardiac medications before your next appointment, please call your pharmacy.  

## 2021-02-28 NOTE — Progress Notes (Signed)
Clinical Summary Ms. Clonch is a 65 y.o.female seen today as a new consult, referred by Dr Jimmye Norman for the following medical problems.   Generalized Fatigue - no specific SOB. No chest pains. No recent edema.  - generalized fatigue. For example feels drained with doing her shopping.   - pcp mentions normal cmet, cbc, tsh    OSA screen - +snoring, +daytime somonlence.    2. Palpitations - 1-2 times per week, feeling of strong heart beats for about 1 minute - limited caffeine, no EtOH    Past Medical History:  Diagnosis Date   Arthritis    Body mass index (BMI) 26.0-26.9, adult    Dysphagia    Elevated cholesterol    Family history of early CAD    Fatigue    History of breast cancer    Hyperlipidemia    Insomnia    Memory loss    Sinus congestion    Vertigo    Vitamin D deficiency    Wears glasses      Allergies  Allergen Reactions   Cataflam [Diclofenac] Hives   Hydrocodone Nausea And Vomiting   Sulfa Antibiotics Hives     Current Outpatient Medications  Medication Sig Dispense Refill   baclofen (LIORESAL) 10 MG tablet Take 10 mg by mouth 3 (three) times daily.     Coenzyme Q10 (COQ10 PO) Take by mouth daily at 2 am.     fexofenadine (ALLEGRA) 180 MG tablet Take 180 mg by mouth daily.     fish oil-omega-3 fatty acids 1000 MG capsule Take 2 g by mouth daily.     ibuprofen (ADVIL) 800 MG tablet Take 800 mg by mouth every 8 (eight) hours as needed.     loratadine (CLARITIN) 10 MG tablet Take 10 mg by mouth daily.     meclizine (ANTIVERT) 12.5 MG tablet Take 12.5 mg by mouth 3 (three) times daily as needed for dizziness.     mirabegron ER (MYRBETRIQ) 25 MG TB24 tablet Take 25 mg by mouth daily.     psyllium (REGULOID) 0.52 G capsule Take 0.52 g by mouth daily.     traMADol (ULTRAM) 50 MG tablet Take 1 tablet (50 mg total) by mouth every 6 (six) hours as needed. 30 tablet 0   No current facility-administered medications for this visit.     Past  Surgical History:  Procedure Laterality Date   ABDOMINAL HYSTERECTOMY     BREAST LUMPECTOMY  10/2005   rt [id number]   CARPAL TUNNEL RELEASE Right 04/14/2014   Procedure: RIGHT CARPAL TUNNEL RELEASE;  Surgeon: Daryll Brod, MD;  Location: [facility];  Service: Orthopedics;  Laterality: Right;  ANESTHESIA: IV REGIONAL FOREARM BLOCK   COLONOSCOPY     ECTOPIC PREGNANCY SURGERY  10/1986   LAPAROSCOPIC CHOLECYSTECTOMY  10/2008     Allergies  Allergen Reactions   Cataflam [Diclofenac] Hives   Hydrocodone Nausea And Vomiting   Sulfa Antibiotics Hives      Family History  Problem Relation Age of Onset   Breast cancer Other    Colon cancer Other    COPD Mother    Heart disease Father      Social History Ms. Hem reports that she quit smoking about 35 years ago. She does not have any smokeless tobacco history on file. Ms. Boda reports no history of alcohol use.   Review of Systems CONSTITUTIONAL: generalized fatigue HEENT: Eyes: No visual loss, blurred vision, double vision or yellow sclerae.No hearing loss,  sneezing, congestion, runny nose or sore throat.  SKIN: No rash or itching.  CARDIOVASCULAR: per hpi RESPIRATORY: No shortness of breath, cough or sputum.  GASTROINTESTINAL: No anorexia, nausea, vomiting or diarrhea. No abdominal pain or blood.  GENITOURINARY: No burning on urination, no polyuria NEUROLOGICAL: No headache, dizziness, syncope, paralysis, ataxia, numbness or tingling in the extremities. No change in bowel or bladder control.  MUSCULOSKELETAL: No muscle, back pain, joint pain or stiffness.  LYMPHATICS: No enlarged nodes. No history of splenectomy.  PSYCHIATRIC: No history of depression or anxiety.  ENDOCRINOLOGIC: No reports of sweating, cold or heat intolerance. No polyuria or polydipsia.  Marland Kitchen   Physical Examination Today's Vitals   02/28/21 1309  BP: 114/70  Pulse: 78  SpO2: 98%  Weight: 149 lb 6.4 oz (67.8 kg)  Height: 5\' 2"  (1.575  m)   Body mass index is 27.33 kg/m.  Gen: resting comfortably, no acute distress HEENT: no scleral icterus, pupils equal round and reactive, no palptable cervical adenopathy,  CV: RRR, no m/rg no jvd Resp: Clear to auscultation bilaterally GI: abdomen is soft, non-tender, non-distended, normal bowel sounds, no hepatosplenomegaly MSK: extremities are warm, no edema.  Skin: warm, no rash Neuro:  no focal deficits Psych: appropriate affect      Assessment and Plan  1.Generalized fatigue - no symptoms of SOB/DOE or chest pain, reports generalized fatigue. Feeling drained and sleepy during the day. Not typical for cardiac symptoms - can check an echo for cardiac structure and function, no indication for ischemic testing at this time - if benign echo can f/u as needed - has some generalized symptoms of OSA, if no other cause found would defer to pcp to consider sleep testing.  - EKG from pcp reviewed, NSR        Arnoldo Lenis, M.D.

## 2021-03-01 NOTE — Addendum Note (Signed)
Addended by: Laurine Blazer on: 03/01/2021 10:49 AM   Modules accepted: Orders

## 2021-03-08 ENCOUNTER — Ambulatory Visit (INDEPENDENT_AMBULATORY_CARE_PROVIDER_SITE_OTHER): Payer: Medicare Other

## 2021-03-08 ENCOUNTER — Other Ambulatory Visit: Payer: Medicare Other

## 2021-03-08 ENCOUNTER — Other Ambulatory Visit: Payer: Self-pay

## 2021-03-08 DIAGNOSIS — R5383 Other fatigue: Secondary | ICD-10-CM | POA: Diagnosis not present

## 2021-03-08 DIAGNOSIS — R002 Palpitations: Secondary | ICD-10-CM

## 2021-03-08 LAB — ECHOCARDIOGRAM COMPLETE
Area-P 1/2: 2.84 cm2
S' Lateral: 2.18 cm
Single Plane A4C EF: 41.8 %

## 2021-03-19 ENCOUNTER — Encounter: Payer: Self-pay | Admitting: Cardiology

## 2021-03-20 ENCOUNTER — Telehealth: Payer: Self-pay | Admitting: Cardiology

## 2021-03-20 NOTE — Telephone Encounter (Signed)
Pt came into office wanting Echo results   Please call [phone]

## 2021-03-20 NOTE — Telephone Encounter (Signed)
Will fwd to provider as results are on his desktop awaiting final review.

## 2021-03-22 NOTE — Telephone Encounter (Signed)
Just resulted   J Levorn Oleski MD 

## 2021-03-26 NOTE — Telephone Encounter (Signed)
Pt notified by C. Wilber Oliphant, RN on 03/23/21.

## 2024-03-11 ENCOUNTER — Inpatient Hospital Stay: Attending: Hematology and Oncology | Admitting: Hematology and Oncology

## 2024-03-11 ENCOUNTER — Encounter: Payer: Self-pay | Admitting: *Deleted

## 2024-03-11 ENCOUNTER — Inpatient Hospital Stay

## 2024-03-11 DIAGNOSIS — Z9049 Acquired absence of other specified parts of digestive tract: Secondary | ICD-10-CM | POA: Insufficient documentation

## 2024-03-11 DIAGNOSIS — C50911 Malignant neoplasm of unspecified site of right female breast: Secondary | ICD-10-CM | POA: Diagnosis not present

## 2024-03-11 DIAGNOSIS — Z8249 Family history of ischemic heart disease and other diseases of the circulatory system: Secondary | ICD-10-CM | POA: Diagnosis not present

## 2024-03-11 DIAGNOSIS — Z87891 Personal history of nicotine dependence: Secondary | ICD-10-CM | POA: Insufficient documentation

## 2024-03-11 DIAGNOSIS — Z800 Family history of malignant neoplasm of digestive organs: Secondary | ICD-10-CM | POA: Diagnosis not present

## 2024-03-11 DIAGNOSIS — Z1732 Human epidermal growth factor receptor 2 negative status: Secondary | ICD-10-CM | POA: Diagnosis not present

## 2024-03-11 DIAGNOSIS — Z90710 Acquired absence of both cervix and uterus: Secondary | ICD-10-CM | POA: Insufficient documentation

## 2024-03-11 DIAGNOSIS — Z79899 Other long term (current) drug therapy: Secondary | ICD-10-CM | POA: Insufficient documentation

## 2024-03-11 DIAGNOSIS — Z885 Allergy status to narcotic agent status: Secondary | ICD-10-CM | POA: Diagnosis not present

## 2024-03-11 DIAGNOSIS — Z825 Family history of asthma and other chronic lower respiratory diseases: Secondary | ICD-10-CM | POA: Diagnosis not present

## 2024-03-11 DIAGNOSIS — Z170 Estrogen receptor positive status [ER+]: Secondary | ICD-10-CM | POA: Diagnosis not present

## 2024-03-11 DIAGNOSIS — Z853 Personal history of malignant neoplasm of breast: Secondary | ICD-10-CM | POA: Diagnosis not present

## 2024-03-11 DIAGNOSIS — Z1722 Progesterone receptor negative status: Secondary | ICD-10-CM | POA: Diagnosis not present

## 2024-03-11 DIAGNOSIS — R232 Flushing: Secondary | ICD-10-CM | POA: Insufficient documentation

## 2024-03-11 DIAGNOSIS — Z803 Family history of malignant neoplasm of breast: Secondary | ICD-10-CM | POA: Diagnosis not present

## 2024-03-11 DIAGNOSIS — N951 Menopausal and female climacteric states: Secondary | ICD-10-CM | POA: Diagnosis not present

## 2024-03-11 DIAGNOSIS — Z882 Allergy status to sulfonamides status: Secondary | ICD-10-CM | POA: Insufficient documentation

## 2024-03-11 DIAGNOSIS — Z8759 Personal history of other complications of pregnancy, childbirth and the puerperium: Secondary | ICD-10-CM | POA: Insufficient documentation

## 2024-03-11 NOTE — Progress Notes (Signed)
 Met with new patient during initial med onc with Dr. Gudena. She was with husband. Patient states still in shock that cancer has returned after 18 years. 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 Tearful at times in appt stating her beloved dog is on his last days and a lot is going on at once. Social work consult entered.  Pt requested Dr. Aron and surgical consult and rad onc consult entered. MD ordered CT CAP/ Bone Scan / breast MRI.   Reviewed role of navigation and patient has my information for questions/concerns.  Gave patient breast cancer information and Journey notebook. Navigator to have patient back to see Dr. Odean about 2-3 weeks post op.

## 2024-03-11 NOTE — Assessment & Plan Note (Addendum)
 10/30/2005: Right lumpectomy: Grade 2 IDC with lobular features 1 cm, margins negative, 0/2 lymph nodes negative, ER 83%, PR 19%, Ki-67 14%, HER2 2+ by IHC  02/23/2024: Screening mammogram detected right breast asymmetry 3 mm by ultrasound biopsy: Grade 2 ILC ER 100% PR 0% Ki67 15%, HER2 negative (2+ by IHC FISH 1.19)  Pathology and radiology counseling: Discussed with the patient, the details of pathology including the type of breast cancer,the clinical staging, the significance of ER, PR and HER-2/neu receptors and the implications for treatment. After reviewing the pathology in detail, we proceeded to discuss the different treatment options between surgery, radiation, chemotherapy, antiestrogen therapies.  Treatment plan: Presentation tumor board regarding surgical options mastectomy versus lumpectomy Discussed with radiation collagen regarding pros and cons of radiation Adjuvant antiestrogen therapy with anastrozole 1 mg daily x 5 years  I did not recommend chemotherapy. For staging we will obtain CT scans and bone scans Because of lobular nature will obtain a breast MRI.  Return to clinic after surgery is complete.

## 2024-03-11 NOTE — Progress Notes (Signed)
 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  A new 3 mm grade 2 invasive lobular carcinoma was identified in the right breast on routine screening mammogram in December 2025. Ultrasound and biopsy showed ER 100% positivity, PR negativity, HER2 negativity (IHC 2+, FISH 1.19), and Ki-67 of 15%. She is asymptomatic without breast mass, pain, bleeding, or other localizing or systemic symptoms including fever, weight loss, or bone pain.  She previously underwent lumpectomy with two negative lymph nodes removed, adjuvant radiation, and four years of tamoxifen for right breast invasive lobular carcinoma diagnosed in 2007. Tamoxifen was stopped for severe hot flashes and menopausal symptoms. She has not received chemotherapy. Genetic testing was negative for pathogenic mutations. She still has occasional hot flashes and is not on anti-estrogen therapy.  She describes the new diagnosis as a big shock and is distressed.     I reviewed her records extensively and collaborated the history with the patient.  SUMMARY OF ONCOLOGIC HISTORY: Oncology History  Recurrent cancer of right breast (HCC)  10/29/2012 Initial Diagnosis   Recurrent cancer of right breast (HCC)   03/11/2024 Cancer Staging   Staging form: Breast, AJCC 7th Edition - Clinical: Stage IA (rT1a, rN0, rM0) - Signed by Odean Potts, MD on 03/11/2024 Stage prefix: Recurrence Laterality: Right Histologic grade (G): G2 Progesterone receptor status:  Positive HER2 status: Negative IHC of regional lymph nodes: Negative      MEDICAL HISTORY:  Past Medical History:  Diagnosis Date   Arthritis    Body mass index (BMI) 26.0-26.9, adult    Dysphagia    Elevated cholesterol    Family history of early CAD    Fatigue    History of breast cancer    Hyperlipidemia    Insomnia    Memory loss    Sinus congestion    Vertigo    Vitamin D deficiency    Wears glasses     SURGICAL HISTORY: Past Surgical History:  Procedure Laterality Date   ABDOMINAL HYSTERECTOMY     BREAST LUMPECTOMY  10/2005   rt lump-snbx-neg-rad   CARPAL TUNNEL RELEASE Right 04/14/2014   Procedure: RIGHT CARPAL TUNNEL RELEASE;  Surgeon: Arley Curia, MD;  Location: [facility];  Service: Orthopedics;  Laterality: Right;  ANESTHESIA: IV REGIONAL FOREARM BLOCK   COLONOSCOPY     ECTOPIC PREGNANCY SURGERY  10/1986   LAPAROSCOPIC CHOLECYSTECTOMY  10/2008    SOCIAL HISTORY: Social History   Socioeconomic History   Marital status: Married    Spouse name: Not on file   Number of children: Not on file   Years of education: Not on file   Highest education level: Not on file  Occupational History   Not on file  Tobacco Use   Smoking status: Former    Current packs/day: 0.00    Types: Cigarettes    Quit date: 07/17/1985    Years since quitting: 38.6   Smokeless tobacco: Not on file  Substance and Sexual Activity   Alcohol use: No   Drug use: No   Sexual  activity: Not on file  Other Topics Concern   Not on file  Social History Narrative   Not on file   Social Drivers of Health   Tobacco Use: Low Risk (06/09/2023)   Received from [facility]   Patient History    Smoking Tobacco Use: Never    Smokeless Tobacco Use: Never    Passive Exposure: Not on file  Financial Resource Strain: Not on file  Food Insecurity: No Food Insecurity (03/07/2024)   Epic    Worried About Programme Researcher, Broadcasting/film/video in the Last Year: Never true    Ran Out of Food in the  Last Year: Never true  Transportation Needs: No Transportation Needs (03/07/2024)   Epic    Lack of Transportation (Medical): No    Lack of Transportation (Non-Medical): No  Physical Activity: Not on file  Stress: Not on file  Social Connections: Not on file  Intimate Partner Violence: Not At Risk (05/22/2023)   Received from [facility]   Epic    Within the last year, have you been afraid of your partner or ex-partner?: No    Within the last year, have you been humiliated or emotionally abused in other ways by your partner or ex-partner?: No    Within the last year, have you been kicked, hit, slapped, or otherwise physically hurt by your partner or ex-partner?: No    Within the last year, have you been raped or forced to have any kind of sexual activity by your partner or ex-partner?: No  Depression (PHQ2-9): Not on file  Alcohol Screen: Not on file  Housing: Low Risk (03/07/2024)   Epic    Unable to Pay for Housing in the Last Year: No    Number of Times Moved in the Last Year: 0    Homeless in the Last Year: No  Utilities: Not At Risk (03/07/2024)   Epic    Threatened with loss of utilities: No  Health Literacy: Low Risk (02/10/2023)   Received from [facility]    How often do you need to have someone help you when you read instructions, pamphlets, or other written material from your doctor or pharmacy?: Never    FAMILY HISTORY: Family History  Problem Relation Age of Onset   Breast cancer Other    Colon cancer Other    COPD Mother    Heart disease Father     ALLERGIES:  is allergic to cataflam [diclofenac], hydrocodone, and sulfa antibiotics.  MEDICATIONS:  Current Outpatient Medications  Medication Sig Dispense Refill   Coenzyme Q10 (COQ10 PO) Take by mouth daily at 2 am.     fexofenadine (ALLEGRA) 180 MG tablet Take 180 mg by mouth daily.     fish oil-omega-3 fatty acids 1000 MG capsule Take 2 g by mouth daily.     ibuprofen (ADVIL) 800 MG  tablet Take 800 mg by mouth every 8 (eight) hours as needed.     loratadine (CLARITIN) 10 MG tablet Take 10 mg by mouth daily.     meclizine (ANTIVERT) 12.5 MG tablet Take 12.5 mg by mouth 3 (three) times daily as needed for dizziness.     omega-3 acid ethyl esters (LOVAZA) 1 g capsule 1 g.     psyllium (REGULOID) 0.52 G capsule Take 0.52 g by mouth daily.     zolpidem (AMBIEN) 10 MG tablet Take 10 mg by mouth at bedtime as needed.     Turmeric 500 MG CAPS  No current facility-administered medications for this visit.    REVIEW OF SYSTEMS:   Constitutional: Denies fevers, chills or abnormal night sweats Breast:  Denies any palpable lumps or discharge All other systems were reviewed with the patient and are negative.  PHYSICAL EXAMINATION: ECOG PERFORMANCE STATUS: 0 - Asymptomatic   GENERAL:alert, no distress and comfortable       ASSESSMENT AND PLAN:  Recurrent cancer of right breast (HCC) 10/30/2005: Right lumpectomy: Grade 2 IDC with lobular features 1 cm, margins negative, 0/2 lymph nodes negative, ER 83%, PR 19%, Ki-67 14%, HER2 2+ by IHC  02/23/2024: Screening mammogram detected right breast asymmetry 3 mm by ultrasound biopsy: Grade 2 ILC ER 100% PR 0% Ki67 15%, HER2 negative (2+ by IHC FISH 1.19)  Pathology and radiology counseling: Discussed with the patient, the details of pathology including the type of breast cancer,the clinical staging, the significance of ER, PR and HER-2/neu receptors and the implications for treatment. After reviewing the pathology in detail, we proceeded to discuss the different treatment options between surgery, radiation, chemotherapy, antiestrogen therapies.  Treatment plan: Presentation tumor board regarding surgical options mastectomy versus lumpectomy Discussed with radiation collagen regarding pros and cons of radiation Adjuvant antiestrogen therapy with anastrozole 1 mg daily x 5 years  I did not recommend chemotherapy. For staging we  will obtain CT scans and bone scans Because of lobular nature will obtain a breast MRI.  Return to clinic after surgery is complete.     All questions were answered. The patient knows to call the clinic with any problems, questions or concerns. I personally spent a total of 30 minutes in the care of the patient today including preparing to see the patient, getting/reviewing separately obtained history, performing a medically appropriate exam/evaluation, counseling and educating, placing orders, referring and communicating with other health care professionals, documenting clinical information in the EHR, independently interpreting results, communicating results, and coordinating care.   Viinay K Machell Wirthlin, MD 03/11/2024

## 2024-03-12 ENCOUNTER — Inpatient Hospital Stay: Admitting: Licensed Clinical Social Worker

## 2024-03-12 NOTE — Progress Notes (Signed)
 [facility] Clinical Social Work  Clinical Social Work was referred by statistician for emotional support.  Clinical Social Worker contacted patient by phone to offer support and assess for needs.     Interventions: Patient interviewed and appropriate assessments performed: brief mental health assessment  Introduced social work and support services available through [facility]   Patient newly diagnosed with ILC. This is her second time with breast cancer, first being in 2007. She reports being upset as it is interfering with her life, but managing and that she has good support from family and friends.  Pt is typically involved with [employer] with both [employer] and [employer] [facility]. She also enjoys time with friends.     Follow Up Plan:  Patient will contact CSW with any support or resource needs    Royce Sciara E Addalynn Kumari, LCSW  Clinical Social Worker [facility]

## 2024-03-15 ENCOUNTER — Encounter: Payer: Self-pay | Admitting: *Deleted

## 2024-03-15 ENCOUNTER — Encounter: Payer: Self-pay | Admitting: Diagnostic Radiology

## 2024-03-15 NOTE — Progress Notes (Signed)
 Received call from patient concerned about when she can get her MRI. Has CT/Bone scan scheduled. Patient was called for MRI in Feb which was too late so she called navigator. I contacted MRI schedulers and they are calling  her with a sooner date. Patient is seeing Dr. Aron tomorrow.  Once surgery is scheduled navigator will request f/u appt with Dr. Odean 2-3 weeks post op.

## 2024-03-15 NOTE — Progress Notes (Signed)
 Patient called navigator back with questions as MRI breast is now the 15th. She wanted to make sure it was fine having that on the 15th and the CT with contrast on the 16th. Patient was also wondering if any appts could be condensed. Call placed to central scheduling and it is fine for CT to be day after MRI. There is no availability in the next two weeks for CT and Bone Scan to be done on the same day. Navigator called patient back and relayed above information. She is aware of her 3 appts and has no other questions at this time.

## 2024-03-16 ENCOUNTER — Other Ambulatory Visit: Payer: Self-pay | Admitting: General Surgery

## 2024-03-16 DIAGNOSIS — C50411 Malignant neoplasm of upper-outer quadrant of right female breast: Secondary | ICD-10-CM

## 2024-03-16 NOTE — Progress Notes (Signed)
 "   REFERRING:  Rocky Don, PA   PROVIDER:  JINA CLAIR NEPHEW, MD  Care Team: Patient Care Team: Don Rocky PARAS as PCP - General Nephew Jina Clair, MD as Consulting Provider (Surgical Oncology)   MRN: [id number] DOB: 1955-11-25 DATE OF ENCOUNTER: 03/16/2024  Subjective   Chief Complaint: Right breast cancer     History of Present Illness: Alexandra Villa is a 69 y.o. female who is seen today as an office consultation at the request of Dr. Don for evaluation of Right breast cancer .   Patient presents to see me in January 2026 with a new diagnosis of right breast cancer December 2025.  She had a recall for screening detected asymmetry.  Diagnostic imaging was performed.  She was seen to have a 3 mm mass at 12:00 approximately 6 cm from the nipple.  The mass was biopsied with a core needle and was seen to be invasive mammary carcinoma, lobular phenotype.  The prognostic panel was ER at 100% positive, PR negative, and HER2 negative.  Ki-67 was 15%.  Of note, the patient has a history of right breast cancer in 2007.  This was treated with lumpectomy, radiation, and antihormonal treatment for 4 years.  This was stopped due to hot flashes.  2 sentinel lymph nodes were negative.  At some point with the previous diagnosis, she did have negative genetic testing.   History of Present Illness [name] is a 69 year old female with prior right breast cancer, status post lumpectomy and radiation, who presents for surgical evaluation of a newly diagnosed 3 mm invasive lobular carcinoma in the right breast.  She was recently diagnosed with a 3 mm invasive lobular carcinoma in the upper-outer quadrant of the right breast on breast imaging. She is asymptomatic and she has no symptoms suggestive of recurrence in the contralateral breast.  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 She  has not experienced significant lymphedema since that time. She completed four years of tamoxifen, which was discontinued. She has not taken anastrozole and expresses concern about potential side effects of anti-hormone therapy, including joint pain, fatigue, insomnia, and exacerbation of arthritis and carpal tunnel syndrome. She identifies her hips and knees as the sites of worst joint pain, but describes the pain as mild. She expresses reluctance to take anti-hormone therapy due to prior side effects and concern for worsening symptoms.  Her previous cancer was treated by Dr. Tona as well as a radiation oncologist at [facility].  She is concerned about the impact of surgery and recovery on her daily life and upcoming travel plans, including a trip to [place] in early March and a cruise in May. She expresses interest in postoperative bra fitting and prosthesis for breast asymmetry, noting difficulty with bra fit since her prior surgery.  She has a history of tubal pregnancy requiring transfusion, with no ongoing issues with venous access. She inquires about IV access logistics for her upcoming contrast studies. She denies history of myocardial infarction, cerebrovascular accident, or venous thromboembolism. She has no current symptoms of infection, chest pain, or dyspnea.   Pt is [place] fan!    Family cancer history - maternal aunt had breast cancer while older and maternal grandfather had colon cancer  Work - retired, like to go on cruises.      Diagnostic mammogram/us  :[facility] 02/16/24 ST DENSITY: b - There are scattered areas of  fibroglandular density.  FINDINGS:  Additional imaging including spot compression tomosynthesis was performed of the right breast. There is an oval circumscribed 3 mm mass in the upper central to slightly inner right breast.  Targeted ultrasound of the right breast was performed. There is an oval circumscribed hypoechoic mass in the right breast at 12:00 6 cm  from nipple measuring 0.3 x 0.2 x 0.3 cm. This corresponds well with mammography findings.     Pathology core needle biopsy: 02/23/2024 Specimen 1: Breast, right, needle core biopsy, 12 oclock mass   - INVASIVE MAMMARY CARCINOMA.  SEE COMMENT.   - TUBULE FORMATION: SCORE 3   - NUCLEAR PLEOMORPHISM: SCORE 2   - MITOTIC COUNT: SCORE 1   - TOTAL SCORE: 6   - OVERALL GRADE: 2   - LYMPHOVASCULAR INVASION: NOT IDENTIFIED   - CANCER LENGTH: 2 MM   - CALCIFICATIONS: NOT IDENTIFIED   Receptors: Estrogen Receptor:  100%, POSITIVE, STRONG STAINING INTENSITY   Progesterone Receptor:  0%, NEGATIVE  GROUP 5: HER2 **NEGATIVE**   Proliferation Marker Ki67:  15%   Review of Systems: A complete review of systems was obtained from the patient.  I have reviewed this information and discussed as appropriate with the patient.  See HPI as well for other ROS.  ROS -neck pain    Medical History: Past Medical History:  Diagnosis Date   Arthritis    History of cancer     Patient Active Problem List  Diagnosis   Malignant neoplasm of upper-outer quadrant of right breast in female, estrogen receptor positive (CMS/HHS-HCC)   History of right breast cancer   Family history of cancer   Postoperative breast asymmetry    Past Surgical History:  Procedure Laterality Date   RIGHT CARPAL TUNNEL RELEASE  04/14/2014   Dr. Murrell     Allergies  Allergen Reactions   Diclofenac Hives   Hydrocodone Other (See Comments) and Nausea And Vomiting   Sulfa (Sulfonamide Antibiotics) Hives    Current Outpatient Medications on File Prior to Visit  Medication Sig Dispense Refill   fexofenadine (ALLEGRA) 180 MG tablet Take 180 mg by mouth once daily     ibuprofen (MOTRIN) 800 MG tablet Take 800 mg by mouth every 8 (eight) hours as needed     omega-3 acid ethyl esters (LOVAZA) 1 gram capsule      turmeric 400 mg Cap Take by mouth     zolpidem (AMBIEN) 10 mg tablet Take 10 mg by mouth at  bedtime as needed     loratadine (CLARITIN) 10 mg tablet Take 10 mg by mouth once daily     meclizine (ANTIVERT) 12.5 mg tablet Take 12.5 mg by mouth once daily as needed for Dizziness     psyllium husk 0.4 gram Cap Take 0.52 g by mouth     No current facility-administered medications on file prior to visit.    Family History  Problem Relation Age of Onset   COPD Mother    Coronary Artery Disease (Blocked arteries around heart) Father    Myocardial Infarction (Heart attack) Father      Social History   Tobacco Use  Smoking Status Former   Types: Cigarettes  Smokeless Tobacco Never     Social History   Socioeconomic History   Marital status: Married  Tobacco Use   Smoking status: Former    Types: Cigarettes   Smokeless tobacco: Never  Substance and Sexual Activity   Drug use: Never   Social Drivers of  Health   Food Insecurity: No Food Insecurity (03/07/2024)   Received from [facility]   Hunger Vital Sign    Within the past 12 months, you worried that your food would run out before you got the money to buy more.: Never true    Within the past 12 months, the food you bought just didn't last and you didn't have money to get more.: Never true  Transportation Needs: No Transportation Needs (03/07/2024)   Received from [facility] - Transportation    In the past 12 months, has lack of transportation kept you from medical appointments or from getting medications?: No    In the past 12 months, has lack of transportation kept you from meetings, work, or from getting things needed for daily living?: No  Housing Stability: Unknown (03/16/2024)   Housing Stability Vital Sign    Homeless in the Last Year: No    Objective:    Vitals:   03/16/24 1449  BP: (!) 146/82  Pulse: 98  Temp: 36.6 C (97.9 F)  SpO2: 98%  Weight: 66.3 kg (146 lb 3.2 oz)  Height: 157.5 cm (5' 2)  PainSc: 1     Body mass index is 26.74 kg/m.  Gen:  No acute distress.   Well nourished and well groomed.   Neurological: Alert and oriented to person, place, and time. Coordination normal.  Head: Normocephalic and atraumatic.  Eyes: Conjunctivae are normal. Pupils are equal, round, and reactive to light. No scleral icterus.  Neck: Normal range of motion. Neck supple. No tracheal deviation or thyromegaly present.  Cardiovascular: Normal rate, regular rhythm, normal heart sounds and intact distal pulses.  Exam reveals no gallop and no friction rub.  No murmur heard. Breast: Right breast smaller than left.  There is a contour defect in the upper breast at the previous incision.  No nipple retraction or nipple discharge is appreciated.  No palpable masses are appreciated.  No lymphadenopathy is appreciated. Respiratory: Effort normal.  No respiratory distress. No chest wall tenderness. Breath sounds normal.  No wheezes, rales or rhonchi.  GI: Soft. Bowel sounds are normal. The abdomen is soft and nontender.  There is no rebound and no guarding.  Musculoskeletal: Normal range of motion. Extremities are nontender.  Lymphadenopathy: No cervical, preauricular, postauricular or axillary adenopathy is present Skin: Skin is warm and dry. No rash noted. No diaphoresis. No erythema. No pallor. No clubbing, cyanosis, or edema.   Psychiatric: Normal mood and affect. Behavior is normal. Judgment and thought content normal.    Labs None available  Assessment and Plan:     ICD-10-CM   1. Malignant neoplasm of upper-outer quadrant of right breast in female, estrogen receptor positive (CMS/HHS-HCC)  C50.411 Ambulatory Referral to Radiation Oncology   Z17.0     2. History of right breast cancer  Z85.3 Ambulatory Referral to Radiation Oncology    3. Family history of cancer  Z80.9     4. Postoperative breast asymmetry  N64.89      Assessment & Plan Malignant neoplasm of upper-outer quadrant of right breast New 3 mm invasive lobular carcinoma, without lymphovascular invasion.  New primary malignancy, not recurrence. Age and tumor size favor breast-conserving therapy; lobular histology requires further evaluation for multifocal disease. - Referred to radiation oncology to evaluate adjuvant radiation therapy necessity, likely omitted due to age and tumor size. - Planned lumpectomy using previous incision for optimal cosmetic outcome. - Ordered preoperative breast MRI. - Ordered CT scan and bone  scan for staging and preoperative evaluation. - Arranged radiology placement of seed marker for intraoperative localization. - Discussed anti-hormone therapy; patient reluctant due to prior side effects. - Educated on lumpectomy risks: bleeding, infection, seroma, breast contour changes, rare cardiopulmonary complications, thromboembolism. - Discussed mastectomy alternative; patient prefers lumpectomy. - Referred to specialized bra store for postoperative fitting and prosthesis.  History of right breast cancer Remote history of right breast cancer treated 18 years ago with lumpectomy and radiation. New primary malignancy confirmed, not recurrence. No intervention needed for contralateral breast. No lymph node surgery planned, low lymphedema risk. - Reviewed prior treatment, confirmed new primary malignancy. - No intervention for contralateral (left) breast. - Physical therapy referral unnecessary as no lymph node surgery planned.  Patient offered plastic surgery referral for breast asymmetry, but does not want to pursue that at this time.  -    No follow-ups on file.   Jina LITTIE Nephew, MD FACS Surgical Oncology, General Surgery, Trauma and Critical Care [facility]    "

## 2024-03-18 ENCOUNTER — Other Ambulatory Visit: Payer: Self-pay | Admitting: General Surgery

## 2024-03-18 ENCOUNTER — Inpatient Hospital Stay: Admission: RE | Admit: 2024-03-18 | Discharge: 2024-03-18 | Attending: Hematology and Oncology

## 2024-03-18 DIAGNOSIS — C50911 Malignant neoplasm of unspecified site of right female breast: Secondary | ICD-10-CM

## 2024-03-18 DIAGNOSIS — C50411 Malignant neoplasm of upper-outer quadrant of right female breast: Secondary | ICD-10-CM

## 2024-03-18 MED ORDER — GADOPICLENOL 0.5 MMOL/ML IV SOLN
7.0000 mL | Freq: Once | INTRAVENOUS | Status: AC | PRN
  Administered 2024-03-18: 7 mL via INTRAVENOUS

## 2024-03-19 ENCOUNTER — Ambulatory Visit (HOSPITAL_COMMUNITY)
Admission: RE | Admit: 2024-03-19 | Discharge: 2024-03-19 | Disposition: A | Discharge status: Home / Self Care | Source: Ambulatory Visit | Attending: Hematology and Oncology | Admitting: Hematology and Oncology

## 2024-03-19 ENCOUNTER — Encounter (HOSPITAL_COMMUNITY): Payer: Self-pay

## 2024-03-19 DIAGNOSIS — C50911 Malignant neoplasm of unspecified site of right female breast: Secondary | ICD-10-CM | POA: Diagnosis present

## 2024-03-19 MED ORDER — IOHEXOL 300 MG/ML  SOLN
100.0000 mL | Freq: Once | INTRAMUSCULAR | Status: AC | PRN
  Administered 2024-03-19: 100 mL via INTRAVENOUS

## 2024-03-21 DIAGNOSIS — C50211 Malignant neoplasm of upper-inner quadrant of right female breast: Secondary | ICD-10-CM | POA: Insufficient documentation

## 2024-03-21 NOTE — Progress Notes (Signed)
 " Radiation Oncology         [phone]) [phone] ________________________________  Name: Alexandra Villa        MRN: [id number]  Date of Service: 03/22/2024 DOB: 1956/01/21  CC:[name], MD  Odean Potts, MD     REFERRING PHYSICIAN: Odean Potts, MD   DIAGNOSIS: The encounter diagnosis was Carcinoma of upper-inner quadrant of right breast in female, estrogen receptor positive (HCC). C50.211, Z17.0    HISTORY OF PRESENT ILLNESS: Alexandra Villa is a 69 y.o. female seen in consultation for radiation therapy.  She has a hx of R breast cancer (Stage I T1N0M0) diagnosed in 2007 and treated with lumpectomy and SLNB, adjuvant radiation and 4 years of adjuvant tamoxifen (discontinued due to hot flashes).  She reportedly underwent genetic testing at the time which was negative.   She presents today after screening mammography in late 2025 revealed a possible asymmetry. Diagnostic mammogram and US  was performed on 02/16/24 for further evaluation and revealed a 3 mm oval, circumscribed mass at the 12 o'clock position in the R breast, approximately 6 cm from the nipple. US  confirmed a corresponding hypoechoic mass measuring 0.3 x 0.2 x 0.3 cm.  She underwent US  guided core needle biopsy on 02/23/24 which revealed Grade 2 invasive mammary carcinoma with lobular phenotype. No LVSI was noted on biopsy. IHC demonstrated ER positivity, 100% strong and PR negativity. Her2 was equivocal at 2+ but negative by FISH. Ki-67 was 15%.  She underwent Breast MRI on 03/18/24 which demonstrated minimal enhancement surrounding the biopsy marker clip in the upper slightly inner R breast spanning 6 mm, corresponding to the known malignancy, without evidence of additional disease in the R breast or any sign of disease in the L breast. No abnormal lymphadenopathy was noted.   Patient also underwent a same day CT CAP W which showed no evidence of lymphadenopathy or metastatic disease. Post-radiation changes were described  in the anterior R lung. A whole body bone scan was completed this morning.   She is currently asymptomatic from her present breast cancer and denies pain, palpable masses and nipple discharge.   Dr. Aron discussed mastectomy and lumpectomy and patient opted to proceed with lumpectomy. This is scheduled for 04/01/24. She is hesitant about adjuvant hormonal therapy given previous side effects.   Estrogen exposure history:  Childbearing/breastfeeding:   Family history of cancer:  PREVIOUS RADIATION THERAPY: Yes to the R breast in 2007  AUTOIMMUNE DISEASE: {EXAM; YES/NO:19492::No}  MEDICAL DEVICES: {EXAM; YES/NO:19492::No}  PREGNANCY: {EXAM; YES/NO:19492::No}   PAST MEDICAL HISTORY:  Past Medical History:  Diagnosis Date   Arthritis    Body mass index (BMI) 26.0-26.9, adult    Dysphagia    Elevated cholesterol    Family history of early CAD    Fatigue    History of breast cancer    Hyperlipidemia    Insomnia    Memory loss    Sinus congestion    Vertigo    Vitamin D deficiency    Wears glasses        PAST SURGICAL HISTORY: Past Surgical History:  Procedure Laterality Date   ABDOMINAL HYSTERECTOMY     BREAST LUMPECTOMY  10/2005   rt [id number]   CARPAL TUNNEL RELEASE Right 04/14/2014   Procedure: RIGHT CARPAL TUNNEL RELEASE;  Surgeon: Arley Curia, MD;  Location:  [facility];  Service: Orthopedics;  Laterality: Right;  ANESTHESIA: IV REGIONAL FOREARM BLOCK   COLONOSCOPY     ECTOPIC PREGNANCY SURGERY  10/1986  LAPAROSCOPIC CHOLECYSTECTOMY  10/2008     FAMILY HISTORY:  Family History  Problem Relation Age of Onset   Breast cancer Other    Colon cancer Other    COPD Mother    Heart disease Father      SOCIAL HISTORY:  reports that she quit smoking about 38 years ago. Her smoking use included cigarettes. She does not have any smokeless tobacco history on file. She reports that she does not drink alcohol and does not use  drugs.   ALLERGIES: Cataflam [diclofenac], Hydrocodone, and Sulfa antibiotics   MEDICATIONS:  Current Outpatient Medications  Medication Sig Dispense Refill   Coenzyme Q10 (COQ10 PO) Take by mouth daily at 2 am.     fexofenadine (ALLEGRA) 180 MG tablet Take 180 mg by mouth daily.     fish oil-omega-3 fatty acids 1000 MG capsule Take 2 g by mouth daily.     ibuprofen (ADVIL) 800 MG tablet Take 800 mg by mouth every 8 (eight) hours as needed.     loratadine (CLARITIN) 10 MG tablet Take 10 mg by mouth daily.     meclizine (ANTIVERT) 12.5 MG tablet Take 12.5 mg by mouth 3 (three) times daily as needed for dizziness.     omega-3 acid ethyl esters (LOVAZA) 1 g capsule 1 g.     psyllium (REGULOID) 0.52 G capsule Take 0.52 g by mouth daily.     Turmeric 500 MG CAPS      zolpidem (AMBIEN) 10 MG tablet Take 10 mg by mouth at bedtime as needed.     No current facility-administered medications for this visit.     REVIEW OF SYSTEMS: The patient reports that they are doing well generally. Pertinent ROS per HPI and otherwise negative.      PHYSICAL EXAM:  Wt Readings from Last 3 Encounters:  02/28/21 149 lb 6.4 oz (67.8 kg)  04/14/14 148 lb 6 oz (67.3 kg)  10/29/12 134 lb 9.6 oz (61.1 kg)   Temp Readings from Last 3 Encounters:  04/14/14 97.5 F (36.4 C)  07/18/11 (!) 96.8 F (36 C) (Temporal)   BP Readings from Last 3 Encounters:  02/28/21 114/70  04/14/14 (!) 161/70  10/29/12 128/80   Pulse Readings from Last 3 Encounters:  02/28/21 78  04/14/14 69  10/29/12 (!) 58    /10   Physical Exam    ECOG = ***   LABORATORY DATA:  Lab Results  Component Value Date   HGB 14.2 04/14/2014   No results found for: NA, K, CL, CO2 No results found for: ALT, AST, GGT, ALKPHOS, BILITOT    RADIOGRAPHY: CT CHEST ABDOMEN PELVIS W CONTRAST Result Date: 03/19/2024 CLINICAL DATA:  Recurrent breast cancer, status post right lumpectomy * Tracking Code: BO * EXAM: CT  CHEST, ABDOMEN, AND PELVIS WITH CONTRAST TECHNIQUE: Multidetector CT imaging of the chest, abdomen and pelvis was performed following the standard protocol during bolus administration of intravenous contrast. RADIATION DOSE REDUCTION: This exam was performed according to the departmental dose-optimization program which includes automated exposure control, adjustment of the mA and/or kV according to patient size and/or use of iterative reconstruction technique. CONTRAST:  OMNIPAQUE  IOHEXOL  300 MG/ML  SOLN COMPARISON:  None Available. FINDINGS: CT CHEST FINDINGS Cardiovascular: Scattered aortic atherosclerosis. Normal heart size. Scattered left coronary artery calcifications. No pericardial effusion. Mediastinum/Nodes: No enlarged mediastinal, hilar, or axillary lymph nodes. Thyroid gland, trachea, and esophagus demonstrate no significant findings. Lungs/Pleura: Minimal subpleural radiation fibrosis in the anterior right lung. No pleural  effusion or pneumothorax. Musculoskeletal: Biopsy marking clip in the superior right breast (series 2, image 24). No acute osseous findings. CT ABDOMEN PELVIS FINDINGS Hepatobiliary: No solid liver abnormality is seen. Cholecystectomy. Mild postoperative biliary ductal dilatation. Pancreas: Unremarkable. No pancreatic ductal dilatation or surrounding inflammatory changes. Spleen: Normal in size without significant abnormality. Adrenals/Urinary Tract: Adrenal glands are unremarkable. Kidneys are normal, without renal calculi, solid lesion, or hydronephrosis. Bladder is unremarkable. Stomach/Bowel: Stomach is within normal limits. Appendix appears normal. No evidence of bowel wall thickening, distention, or inflammatory changes. Large burden of stool in the colon. Vascular/Lymphatic: Aortic atherosclerosis. No enlarged abdominal or pelvic lymph nodes. Reproductive: Hysterectomy. Other: No abdominal wall hernia or abnormality. No ascites. Musculoskeletal: No acute osseous findings.  IMPRESSION: 1. Biopsy marking clip in the superior right breast. 2. No evidence of lymphadenopathy or metastatic disease in the chest, abdomen, or pelvis. 3. Minimal subpleural radiation fibrosis in the anterior right lung. 4. Coronary artery disease. Aortic Atherosclerosis (ICD10-I70.0). Electronically Signed   By: Marolyn JONETTA Jaksch M.D.   On: 03/19/2024 10:35   MR BREAST BILATERAL W WO CONTRAST INC CAD Result Date: 03/18/2024 CLINICAL DATA:  Recently diagnosed 3 mm right breast malignancy, biopsied under ultrasound guidance on 02/23/2024. Patient has a history of a right lumpectomy for breast carcinoma in 2007 with adjuvant radiation therapy. Current exam is to assess extent of disease. EXAM: BILATERAL BREAST MRI WITH AND WITHOUT CONTRAST TECHNIQUE: Multiplanar, multisequence MR images of both breasts were obtained prior to and following the intravenous administration of 7 ml of Vueway . Three-dimensional MR images were rendered by post-processing of the original MR data on an independent workstation. The three-dimensional MR images were interpreted, and findings are reported in the following complete MRI report for this study. Three dimensional images were evaluated at the independent interpreting workstation using the DynaCAD thin client. COMPARISON:  Prior exams including the prior breast MRI from 11/15/2007. FINDINGS: Breast composition: b. Scattered fibroglandular tissue. Background parenchymal enhancement: Mild Right breast: Minimal low level enhancement surrounds susceptibility artifact from the ribbon shaped post biopsy marker clip in the posterior upper, slightly inner right breast corresponding to the recently biopsied carcinoma. Enhancement spans 6 mm. There are no defined right breast masses or other areas of abnormal enhancement. Stable post lumpectomy changes are noted in the upper breast. Left breast: No mass or abnormal enhancement. Lymph nodes: No abnormal appearing lymph nodes. Ancillary findings:   None. IMPRESSION: 1. Minimal enhancement is seen associated with the ribbon shaped post biopsy marker clip in the upper slightly inner right breast corresponding to the biopsy-proven malignancy. 2. No MRI evidence of additional right breast malignancy. Stable benign post lumpectomy changes on the right. 3. No MRI evidence of left breast malignancy. 4. No enlarged or abnormal lymph nodes. RECOMMENDATION: 1. Treatment as planned for the known right breast malignancy. BI-RADS CATEGORY  6: Known biopsy-proven malignancy. Electronically Signed   By: Alm Parkins M.D.   On: 03/18/2024 11:31   Diagnostic mammogram/us  02/16/24:  DENSITY: b - There are scattered areas of fibroglandular density.   FINDINGS:  Additional imaging including spot compression tomosynthesis was performed of the right breast. There is an oval circumscribed 3 mm mass in the upper central to slightly inner right breast.  Targeted ultrasound of the right breast was performed. There is an oval circumscribed hypoechoic mass in the right breast at 12:00 6 cm from nipple measuring 0.3 x 0.2 x 0.3 cm. This corresponds well with mammography findings.    PATHOLOGY:  R Breast Biopsy 02/23/24:  Specimen 1: Breast, right, needle core biopsy, 12 oclock mass   - INVASIVE MAMMARY CARCINOMA.  SEE COMMENT.   - TUBULE FORMATION: SCORE 3   - NUCLEAR PLEOMORPHISM: SCORE 2   - MITOTIC COUNT: SCORE 1   - TOTAL SCORE: 6   - OVERALL GRADE: 2   - LYMPHOVASCULAR INVASION: NOT IDENTIFIED   - CANCER LENGTH: 2 MM   - CALCIFICATIONS: NOT IDENTIFIED   Comment: The lesion is comprised of cells with plasmacytoid and/or apocrine features. E-Cadherin immunostaining is weakly positive for patchy membranous staining. Myoepithelial markers are negative, consistent with the invasive process. ER, cytokeratin 7 and GATA 3 immunostains are positive, and cytokeratin 20 is negative, consistent with the diagnosis. Based on the morphologic and immunohistochemical  findings, I favor a lobular phenotype. Material will be taken for receptor studies, and the results will be reported in an addendum.   IMMUNOHISTOCHEMICAL AND MORPHOMETRIC ANALYSIS PERFORMED MANUALLY(See comment)   The tumor cells are EQUIVOCAL for Her2 (2+). Her2 by FISH will be performed and the results reported separately.   Estrogen Receptor:  100%, POSITIVE, STRONG STAINING INTENSITY   Progesterone Receptor:  0%, NEGATIVE   Proliferation Marker Ki67:  15%   FLUORESCENCE IN-SITU HYBRIDIZATION   Results:   GROUP 5:   HER2 **NEGATIVE**      IMPRESSION/PLAN:   Patient with a hx of a Stage I T1N0M0 R breast cancer s/p lumpectomy, radiation and hormonal therapy in 2007 now with a new primary R breast cancer, Stage I T1aN0M0 invasive lobular carcinoma, ER+/PR-/Her2-, Ki-67 15% who is seen pre-operatively for consideration of adjuvant radiation therapy. We discussed the role of breast irradiation in reducing the risk of local recurrence as well as its heightened risk of side effects in setting of prior radiation   We reviewed the logistics of treatment as well as the short term and long term side effects in setting of prior radiation. Acute side effects include fatigue, skin erythema, tanning or hyperpigmentation, breast edema or firmness and mild tenderness.  These may be more prolonged or more pronounced in setting of prior radiation. A less common but possible side effect is desquamation of the skin which may be more likely in setting of prior radiation. Long-term risks of reirradiation include breast fibrosis, telangiectasia, skin atrophy, chronic hyperpigmentation, skin necrosis or ulceration, breast shrinkage and contracture, fat necrosis, wound healing complications, rib fracture, chest wall pain and increased fibrosis of the irradiated lung.   --  Total time spent today in preparation for this visit was *** minutes. This included patient care, imaging and path review, documentation,  multidisciplinary discussion and coordination of care and follow up.    Estefana HERO. Maritza, M.D.   "

## 2024-03-22 ENCOUNTER — Ambulatory Visit
Admission: RE | Admit: 2024-03-22 | Discharge: 2024-03-22 | Disposition: A | Discharge status: Home / Self Care | Source: Ambulatory Visit | Attending: Radiation Oncology | Admitting: Radiation Oncology

## 2024-03-22 ENCOUNTER — Ambulatory Visit (HOSPITAL_COMMUNITY)
Admission: RE | Admit: 2024-03-22 | Discharge: 2024-03-22 | Disposition: A | Discharge status: Home / Self Care | Source: Ambulatory Visit | Attending: Hematology and Oncology | Admitting: Hematology and Oncology

## 2024-03-22 ENCOUNTER — Encounter: Payer: Self-pay | Admitting: *Deleted

## 2024-03-22 VITALS — BP 146/87 | HR 71 | Temp 97.5°F | Resp 18 | Ht 62.0 in | Wt 148.8 lb

## 2024-03-22 DIAGNOSIS — C50911 Malignant neoplasm of unspecified site of right female breast: Secondary | ICD-10-CM | POA: Insufficient documentation

## 2024-03-22 DIAGNOSIS — C50211 Malignant neoplasm of upper-inner quadrant of right female breast: Secondary | ICD-10-CM

## 2024-03-22 DIAGNOSIS — Z170 Estrogen receptor positive status [ER+]: Secondary | ICD-10-CM

## 2024-03-22 MED ORDER — TECHNETIUM TC 99M MEDRONATE IV KIT
20.0000 | PACK | Freq: Once | INTRAVENOUS | Status: AC | PRN
  Administered 2024-03-22: 21.5 via INTRAVENOUS

## 2024-03-22 NOTE — Progress Notes (Addendum)
 Location of Breast Cancer:Carcinoma of upper-inner quadrant of right breast in female, estrogen receptor positive   Histology per Pathology Report:   Receptor Status: ER(positive), PR (negative), Her2-neu (negative), Ki-67(15%)  Did patient present with symptoms (if so, please: Screening Mammography note symptoms) or was this found on screening mammography?:   Past/Anticipated interventions by surgeon, if any:  Date: 04/01/2024 Aron Shoulders, MD  Past/Anticipated interventions by medical oncology, if any:  Requested f/u appt with Dr. Gudena for 2-3 weeks post 1/29 surgery.          Lymphedema issues, if any:  Denies  Pain issues, if any:  Denies  SAFETY ISSUES: Prior radiation? Yes, 2007 to right breast  Pacemaker/ICD? No Possible current pregnancy?No Is the patient on methotrexate? No  Current Complaints / other details:       BP (!) 146/87 (BP Location: Left Arm, Patient Position: Sitting, Cuff Size: Large)   Pulse 71   Temp (!) 97.5 F (36.4 C)   Resp 18   Ht 5' 2 (1.575 m)   Wt 148 lb 12.8 oz (67.5 kg)   SpO2 100%   BMI 27.22 kg/m

## 2024-03-22 NOTE — Progress Notes (Unsigned)
 Returned call as patient had left navigator message with a question about surgery. Left message to call me back if still has questions. She is currently having Bone Scan now. Had CT and breast MRI last week. Requested f/u appt with Dr. Gudena for 2-3 weeks post 1/29 surgery.

## 2024-03-25 NOTE — Pre-Procedure Instructions (Signed)
 Surgical Instructions   Your procedure is scheduled on Thursday, January 29th. Report to [facility] Main Entrance A at 11:15 A.M., then check in with the Admitting office. Any questions or running late day of surgery: call [phone]  Questions prior to your surgery date: call [phone], Monday-Friday, 8am-4pm. If you experience any cold or flu symptoms such as cough, fever, chills, shortness of breath, etc. between now and your scheduled surgery, please notify us  at the above number.     Remember:  Do not eat after midnight the night before your surgery   You may drink clear liquids until 10:15 AM the morning of your surgery.   Clear liquids allowed are: Water, Non-Citrus Juices (without pulp), Carbonated Beverages, Clear Tea (no milk, honey, etc.), Black Coffee Only (NO MILK, CREAM OR POWDERED CREAMER of any kind), and Gatorade.    Take these medicines the morning of surgery with A SIP OF WATER  fexofenadine (ALLEGRA)  loratadine (CLARITIN)   May take these medicines IF NEEDED: meclizine (ANTIVERT)    One week prior to surgery, STOP taking any Aspirin (unless otherwise instructed by your surgeon) Aleve, Naproxen, Ibuprofen, Motrin, Advil, Goody's, BC's, all herbal medications, fish oil, and non-prescription vitamins.                     Do NOT Smoke (Tobacco/Vaping) for 24 hours prior to your procedure.  If you use a CPAP at night, you may bring your mask/headgear for your overnight stay.   You will be asked to remove any contacts, glasses, piercing's, hearing aid's, dentures/partials prior to surgery. Please bring cases for these items if needed.    Your surgeon will determine if you are to be admitted or discharged the same day.  Patients discharged the day of surgery will not be allowed to drive home, and someone needs to stay with them for 24 hours.  SURGICAL WAITING ROOM VISITATION Patients may have no more than 2 support people in the waiting area - these  visitors may rotate.   Pre-op nurse will coordinate an appropriate time for 2 ADULT support persons, who may not rotate, to accompany patient in pre-op.  Children under the age of 48 must have an adult with them who is not the patient and must remain in the main waiting area with an adult.  If the patient needs to stay at the hospital during part of their recovery, the visitor guidelines for inpatient rooms apply.  Please refer to the [facility] website for the visitor guidelines for any additional information.   If you received a COVID test during your pre-op visit  it is requested that you wear a mask when out in public, stay away from anyone that may not be feeling well and notify your surgeon if you develop symptoms. If you have been in contact with anyone that has tested positive in the last 10 days please notify you surgeon.      Pre-operative CHG Bathing Instructions   You can play a key role in reducing the risk of infection after surgery. Your skin needs to be as free of germs as possible. You can reduce the number of germs on your skin by washing with CHG (chlorhexidine  gluconate) soap before surgery. CHG is an antiseptic soap that kills germs and continues to kill germs even after washing.   DO NOT use if you have an allergy to chlorhexidine /CHG or antibacterial soaps. If your skin becomes reddened or irritated, stop using the CHG and  notify one of our RNs at [phone].              TAKE A SHOWER THE NIGHT BEFORE SURGERY   Please keep in mind the following:  DO NOT shave, including legs and underarms, 48 hours prior to surgery.   You may shave your face before/day of surgery.  Place clean sheets on your bed the night before surgery Use a clean washcloth (not used since being washed) for shower. DO NOT sleep with pet's night before surgery.  CHG Shower Instructions:  Wash your face and private area with normal soap. If you choose to wash your hair, wash first with your  normal shampoo.  After you use shampoo/soap, rinse your hair and body thoroughly to remove shampoo/soap residue.  Turn the water OFF and apply half the bottle of CHG soap to a CLEAN washcloth.  Apply CHG soap ONLY FROM YOUR NECK DOWN TO YOUR TOES (washing for 3-5 minutes)  DO NOT use CHG soap on face, private areas, open wounds, or sores.  Pay special attention to the area where your surgery is being performed.  If you are having back surgery, having someone wash your back for you may be helpful. Wait 2 minutes after CHG soap is applied, then you may rinse off the CHG soap.  Pat dry with a clean towel  Put on clean pajamas    Additional instructions for the day of surgery: If you choose, you may shower the morning of surgery with an antibacterial soap.  DO NOT APPLY any lotions, deodorants, cologne, or perfumes.   Do not wear jewelry or makeup Do not wear nail polish, gel polish, artificial nails, or any other type of covering on natural nails (fingers and toes) Do not bring valuables to the hospital. [facility] is not responsible for valuables/personal belongings. Put on clean/comfortable clothes.  Please brush your teeth.  Ask your nurse before applying any prescription medications to the skin.

## 2024-03-26 ENCOUNTER — Encounter (HOSPITAL_COMMUNITY): Payer: Self-pay

## 2024-03-26 ENCOUNTER — Encounter (HOSPITAL_COMMUNITY)
Admission: RE | Admit: 2024-03-26 | Discharge: 2024-03-26 | Disposition: A | Source: Ambulatory Visit | Attending: General Surgery

## 2024-03-26 ENCOUNTER — Other Ambulatory Visit: Payer: Self-pay

## 2024-03-26 VITALS — BP 163/83 | HR 78 | Temp 98.1°F | Resp 18 | Ht 62.0 in | Wt 148.0 lb

## 2024-03-26 DIAGNOSIS — Z01818 Encounter for other preprocedural examination: Secondary | ICD-10-CM | POA: Diagnosis present

## 2024-03-26 DIAGNOSIS — Z853 Personal history of malignant neoplasm of breast: Secondary | ICD-10-CM | POA: Insufficient documentation

## 2024-03-26 DIAGNOSIS — E785 Hyperlipidemia, unspecified: Secondary | ICD-10-CM | POA: Diagnosis not present

## 2024-03-26 DIAGNOSIS — Z01812 Encounter for preprocedural laboratory examination: Secondary | ICD-10-CM | POA: Diagnosis not present

## 2024-03-26 DIAGNOSIS — Z923 Personal history of irradiation: Secondary | ICD-10-CM | POA: Insufficient documentation

## 2024-03-26 DIAGNOSIS — C50911 Malignant neoplasm of unspecified site of right female breast: Secondary | ICD-10-CM | POA: Insufficient documentation

## 2024-03-26 DIAGNOSIS — M797 Fibromyalgia: Secondary | ICD-10-CM | POA: Diagnosis not present

## 2024-03-26 DIAGNOSIS — Z9889 Other specified postprocedural states: Secondary | ICD-10-CM | POA: Diagnosis not present

## 2024-03-26 DIAGNOSIS — Z87891 Personal history of nicotine dependence: Secondary | ICD-10-CM | POA: Diagnosis not present

## 2024-03-26 HISTORY — DX: Fibromyalgia: M79.7

## 2024-03-26 HISTORY — DX: Malignant (primary) neoplasm, unspecified: C80.1

## 2024-03-26 LAB — CBC
HCT: 44.1 % (ref 36.0–46.0)
Hemoglobin: 14.4 g/dL (ref 12.0–15.0)
MCH: 31 pg (ref 26.0–34.0)
MCHC: 32.7 g/dL (ref 30.0–36.0)
MCV: 94.8 fL (ref 80.0–100.0)
Platelets: 207 K/uL (ref 150–400)
RBC: 4.65 MIL/uL (ref 3.87–5.11)
RDW: 12 % (ref 11.5–15.5)
WBC: 7 K/uL (ref 4.0–10.5)
nRBC: 0 % (ref 0.0–0.2)

## 2024-03-26 NOTE — Progress Notes (Signed)
 PCP - Rocky Don PA-C Cardiologist - Dorn Ross (last seen 2023, f/u PRN. No cardiac s/sx)  PPM/ICD - denies  Chest x-ray - denies EKG - denies Stress Test - denies ECHO - 03/08/21 Cardiac Cath - denies  Sleep Study - denies  No DM.  Last dose of GLP1 agonist-  denies  Blood Thinner Instructions: NA Aspirin Instructions: NA  ERAS Protcol - clear liquids until 10:15   COVID TEST- NA   Anesthesia review: yes, breast seed   Patient denies shortness of breath, fever, cough and chest pain at PAT appointment. Denies any respiratory symptoms in the last two months.    All instructions explained to the patient, with a verbal understanding of the material. Patient agrees to go over the instructions while at home for a better understanding. The opportunity to ask questions was provided.

## 2024-03-29 NOTE — Anesthesia Preprocedure Evaluation (Signed)
"                                    Anesthesia Evaluation  Patient identified by MRN, date of birth, ID band Patient awake    Reviewed: Allergy & Precautions, NPO status , Patient's Chart, lab work & pertinent test results  Airway Mallampati: II  TM Distance: >3 FB Neck ROM: Full    Dental  (+) Teeth Intact, Dental Advisory Given   Pulmonary former smoker   Pulmonary exam normal breath sounds clear to auscultation       Cardiovascular negative cardio ROS Normal cardiovascular exam Rhythm:Regular Rate:Normal     Neuro/Psych negative neurological ROS     GI/Hepatic negative GI ROS, Neg liver ROS,,,  Endo/Other  negative endocrine ROS    Renal/GU negative Renal ROS     Musculoskeletal  (+) Arthritis ,  Fibromyalgia -  Abdominal   Peds  Hematology negative hematology ROS (+)   Anesthesia Other Findings Day of surgery medications reviewed with the patient.  RIGHT BREAST CANCER  Reproductive/Obstetrics                              Anesthesia Physical Anesthesia Plan  ASA: 2  Anesthesia Plan: General   Post-op Pain Management: Tylenol  PO (pre-op)* and Toradol IV (intra-op)*   Induction: Intravenous  PONV Risk Score and Plan: 3 and Midazolam , Dexamethasone  and Ondansetron   Airway Management Planned: LMA  Additional Equipment:   Intra-op Plan:   Post-operative Plan: Extubation in OR  Informed Consent: I have reviewed the patients History and Physical, chart, labs and discussed the procedure including the risks, benefits and alternatives for the proposed anesthesia with the patient or authorized representative who has indicated his/her understanding and acceptance.     Dental advisory given  Plan Discussed with: CRNA  Anesthesia Plan Comments: (PAT note written 03/29/2024 by Kamau Weatherall, PA-C.  )         Anesthesia Quick Evaluation  "

## 2024-03-29 NOTE — Progress Notes (Signed)
 Anesthesia Chart Review:  Case: [id number] Date/Time: 04/01/24 1303   Procedure: BREAST LUMPECTOMY WITH RADIOACTIVE SEED LOCALIZATION (Right: Breast) - RIGHT BREAST SEED-LOCALIZED LUMPECTOMY   Anesthesia type: General   Pre-op diagnosis: RIGHT BREAST CANCER   [facility]: [facility] / [facility]   Surgeons: Aron Shoulders, MD       DISCUSSION: Patient is a 69 year old female scheduled for the above procedure.  History includes former smoker (quit 07/17/1985), HLD, breast cancer (right partial mastectomy 10/30/2005, radiation; new right breast cancer 02/2024), fibromyalgia, memory loss, vertigo, cholecystectomy, hysterectomy.  RSL is scheduled for 03/30/2024. Anesthesia team to evaluate on the day of surgery.   VS: BP (!) 163/83   Pulse 78   Temp 36.7 C   Resp 18   Ht 5' 2 (1.575 m)   Wt 67.1 kg   SpO2 100%   BMI 27.07 kg/m   PROVIDERS: Dow Longs, PA-C is PCP  Odean Potts, MD is HEM-ONC Maritza Stagger, MD is RAD-ONC - She is not routinely followed by cardiology but had an evaluation by Alvan Carrier, MD in December 2022 for palpitations. She had a normal TTE 03/2021 and as needed follow-up recommended.   LABS: Labs reviewed: Acceptable for surgery. (all labs ordered are listed, but only abnormal results are displayed)  Labs Reviewed  CBC    IMAGES: NM Bone Scan Whole Body 03/22/2024: IMPRESSION: Negative whole body bone scan for osseous metastatic disease.   CTA Chest/abd/pelvis 03/19/2024: IMPRESSION: 1. Biopsy marking clip in the superior right breast. 2. No evidence of lymphadenopathy or metastatic disease in the chest, abdomen, or pelvis. 3. Minimal subpleural radiation fibrosis in the anterior right lung. 4. Coronary artery disease.  - Aortic Atherosclerosis (ICD10-I70.0).   EKG: Per 02/28/2021 cardiology note, EKG from PCP then showed NSR.   CV: Echo 03/08/2021: IMPRESSIONS   1. Left ventricular ejection fraction, by estimation, is 60 to 65%. The  left  ventricle has normal function. The left ventricle has no regional  wall motion abnormalities. Left ventricular diastolic parameters were  normal.   2. Right ventricular systolic function is normal. The right ventricular  size is normal. There is normal pulmonary artery systolic pressure. The  estimated right ventricular systolic pressure is 22.5 mmHg.   3. The mitral valve is grossly normal. Trivial mitral valve  regurgitation.   4. The aortic valve is tricuspid. Aortic valve regurgitation is not  visualized.   5. The inferior vena cava is normal in size with greater than 50%  respiratory variability, suggesting right atrial pressure of 3 mmHg.  - Comparison(s): No prior Echocardiogram.   Past Medical History:  Diagnosis Date   Arthritis    Body mass index (BMI) 26.0-26.9, adult    Cancer (HCC)    Dysphagia    Elevated cholesterol    Family history of early CAD    Fatigue    Fibromyalgia    History of breast cancer    Hyperlipidemia    Insomnia    Memory loss    Sinus congestion    Vertigo    Vitamin D deficiency    Wears glasses     Past Surgical History:  Procedure Laterality Date   ABDOMINAL HYSTERECTOMY     BREAST LUMPECTOMY  10/02/2005   rt [id number]   CARPAL TUNNEL RELEASE Right 04/14/2014   Procedure: RIGHT CARPAL TUNNEL RELEASE;  Surgeon: Arley Curia, MD;  Location: [facility];  Service: Orthopedics;  Laterality: Right;  ANESTHESIA: IV REGIONAL FOREARM BLOCK  CARPAL TUNNEL RELEASE     COLONOSCOPY     ECTOPIC PREGNANCY SURGERY  10/03/1986   KNEE ARTHROSCOPY Left    LAPAROSCOPIC CHOLECYSTECTOMY  10/02/2008    MEDICATIONS:  Calcium Carb-Cholecalciferol (CALCIUM + VITAMIN D3 PO)   ciclopirox (PENLAC) 8 % solution   Coenzyme Q10 (COQ10 PO)   fexofenadine (ALLEGRA) 180 MG tablet   ibuprofen (ADVIL) 800 MG tablet   loratadine (CLARITIN) 10 MG tablet   meclizine (ANTIVERT) 12.5 MG tablet   omega-3 acid ethyl esters (LOVAZA) 1 g capsule    OVER THE COUNTER MEDICATION   PSYLLIUM PO   Turmeric 500 MG CAPS   zolpidem (AMBIEN) 10 MG tablet   No current facility-administered medications for this encounter.    Isaiah Ruder, PA-C Surgical Short Stay/Anesthesiology [facility] Phone [phone] [facility] Phone [phone] 03/29/2024 9:21 PM

## 2024-03-30 ENCOUNTER — Ambulatory Visit
Admission: RE | Admit: 2024-03-30 | Discharge: 2024-03-30 | Disposition: A | Source: Ambulatory Visit | Attending: General Surgery | Admitting: General Surgery

## 2024-03-30 ENCOUNTER — Other Ambulatory Visit: Payer: Self-pay | Admitting: General Surgery

## 2024-03-30 DIAGNOSIS — Z170 Estrogen receptor positive status [ER+]: Secondary | ICD-10-CM

## 2024-03-30 NOTE — H&P (Signed)
 REFERRING: Rocky Don, PA  PROVIDER: JINA CLAIR NEPHEW, MD  Care Team: Patient Care Team: Don Rocky PARAS as PCP - General Nephew Jina Clair, MD as Consulting Provider (Surgical Oncology)   MRN: [id number] DOB: 06/28/1955 DATE OF ENCOUNTER: 03/16/2024  Subjective   Chief Complaint: Right breast cancer   History of Present Illness: Alexandra Villa is a 69 y.o. female who is seen today as an office consultation at the request of Dr. Don for evaluation of Right breast cancer .  Patient presents to see me in January 2026 with a new diagnosis of right breast cancer December 2025. She had a recall for screening detected asymmetry. Diagnostic imaging was performed. She was seen to have a 3 mm mass at 12:00 approximately 6 cm from the nipple. The mass was biopsied with a core needle and was seen to be invasive mammary carcinoma, lobular phenotype. The prognostic panel was ER at 100% positive, PR negative, and HER2 negative. Ki-67 was 15%.  Of note, the patient has a history of right breast cancer in 2007. This was treated with lumpectomy, radiation, and antihormonal treatment for 4 years. This was stopped due to hot flashes. 2 sentinel lymph nodes were negative.  At some point with the previous diagnosis, she did have negative genetic testing.  History of Present Illness Alexandra Villa is a 69 year old female with prior right breast cancer, status post lumpectomy and radiation, who presents for surgical evaluation of a newly diagnosed 3 mm invasive lobular carcinoma in the right breast.  She was recently diagnosed with a 3 mm invasive lobular carcinoma in the upper-outer quadrant of the right breast on breast imaging. She is asymptomatic and she has no symptoms suggestive of recurrence in the contralateral breast.  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 She has not experienced  significant lymphedema since that time. She completed four years of tamoxifen, which was discontinued. She has not taken anastrozole and expresses concern about potential side effects of anti-hormone therapy, including joint pain, fatigue, insomnia, and exacerbation of arthritis and carpal tunnel syndrome. She identifies her hips and knees as the sites of worst joint pain, but describes the pain as mild. She expresses reluctance to take anti-hormone therapy due to prior side effects and concern for worsening symptoms.  Her previous cancer was treated by Dr. Tona as well as a radiation oncologist at [facility].  She is concerned about the impact of surgery and recovery on her daily life and upcoming travel plans, including a trip to [place] in early March and a cruise in May. She expresses interest in postoperative bra fitting and prosthesis for breast asymmetry, noting difficulty with bra fit since her prior surgery.  She has a history of tubal pregnancy requiring transfusion, with no ongoing issues with venous access. She inquires about IV access logistics for her upcoming contrast studies. She denies history of myocardial infarction, cerebrovascular accident, or venous thromboembolism. She has no current symptoms of infection, chest pain, or dyspnea.  Pt is [employer] fan!  Family cancer history - maternal aunt had breast cancer while older and maternal grandfather had colon cancer  Work - retired, like to go on cruises.   Diagnostic mammogram/us  :[facility] 02/16/24 ST DENSITY: b - There are scattered areas of fibroglandular density.  FINDINGS:  Additional imaging including spot compression tomosynthesis was performed of the right breast. There is an oval circumscribed 3 mm mass  in the upper central to slightly inner right breast.  Targeted ultrasound of the right breast was performed. There is an oval circumscribed hypoechoic mass in the right breast at 12:00 6 cm from nipple measuring 0.3 x 0.2 x  0.3 cm. This corresponds well with mammography findings.   Pathology core needle biopsy: 02/23/2024 Specimen 1: Breast, right, needle core biopsy, 12 oclock mass  - INVASIVE MAMMARY CARCINOMA. SEE COMMENT.  - TUBULE FORMATION: SCORE 3  - NUCLEAR PLEOMORPHISM: SCORE 2  - MITOTIC COUNT: SCORE 1  - TOTAL SCORE: 6  - OVERALL GRADE: 2  - LYMPHOVASCULAR INVASION: NOT IDENTIFIED  - CANCER LENGTH: 2 MM  - CALCIFICATIONS: NOT IDENTIFIED   Receptors: Estrogen Receptor: 100%, POSITIVE, STRONG STAINING INTENSITY  Progesterone Receptor: 0%, NEGATIVE  GROUP 5: HER2 **NEGATIVE**  Proliferation Marker Ki67: 15%  Review of Systems: A complete review of systems was obtained from the patient. I have reviewed this information and discussed as appropriate with the patient. See HPI as well for other ROS.  ROS -neck pain  Medical History: Past Medical History:  Diagnosis Date  Arthritis  History of cancer   Patient Active Problem List  Diagnosis  Malignant neoplasm of upper-outer quadrant of right breast in female, estrogen receptor positive (CMS/HHS-HCC)  History of right breast cancer  Family history of cancer  Postoperative breast asymmetry   Past Surgical History:  Procedure Laterality Date  RIGHT CARPAL TUNNEL RELEASE 04/14/2014  Dr. Murrell    Allergies  Allergen Reactions  Diclofenac Hives  Hydrocodone Other (See Comments) and Nausea And Vomiting  Sulfa (Sulfonamide Antibiotics) Hives   Current Outpatient Medications on File Prior to Visit  Medication Sig Dispense Refill  fexofenadine (ALLEGRA) 180 MG tablet Take 180 mg by mouth once daily  ibuprofen (MOTRIN) 800 MG tablet Take 800 mg by mouth every 8 (eight) hours as needed  omega-3 acid ethyl esters (LOVAZA) 1 gram capsule  turmeric 400 mg Cap Take by mouth  zolpidem (AMBIEN) 10 mg tablet Take 10 mg by mouth at bedtime as needed  loratadine (CLARITIN) 10 mg tablet Take 10 mg by mouth once daily  meclizine (ANTIVERT) 12.5 mg  tablet Take 12.5 mg by mouth once daily as needed for Dizziness  psyllium husk 0.4 gram Cap Take 0.52 g by mouth   No current facility-administered medications on file prior to visit.   Family History  Problem Relation Age of Onset  COPD Mother  Coronary Artery Disease (Blocked arteries around heart) Father  Myocardial Infarction (Heart attack) Father    Social History   Tobacco Use  Smoking Status Former  Types: Cigarettes  Smokeless Tobacco Never    Social History   Socioeconomic History  Marital status: Married  Tobacco Use  Smoking status: Former  Types: Cigarettes  Smokeless tobacco: Never  Substance and Sexual Activity  Drug use: Never   Social Drivers of Architectural Technologist Insecurity: No Food Insecurity (03/07/2024)  Received from [facility]  Hunger Vital Sign  Within the past 12 months, you worried that your food would run out before you got the money to buy more.: Never true  Within the past 12 months, the food you bought just didn't last and you didn't have money to get more.: Never true  Transportation Needs: No Transportation Needs (03/07/2024)  Received from [facility] - Transportation  In the past 12 months, has lack of transportation kept you from medical appointments or from getting medications?: No  In the past 12  months, has lack of transportation kept you from meetings, work, or from getting things needed for daily living?: No  Housing Stability: Unknown (03/16/2024)  Housing Stability Vital Sign  Homeless in the Last Year: No   Objective:   Vitals:  03/16/24 1449  BP: (!) 146/82  Pulse: 98  Temp: 36.6 C (97.9 F)  SpO2: 98%  Weight: 66.3 kg (146 lb 3.2 oz)  Height: 157.5 cm (5' 2)  PainSc: 1   Body mass index is 26.74 kg/m.  Gen: No acute distress. Well nourished and well groomed.  Neurological: Alert and oriented to person, place, and time. Coordination normal.  Head: Normocephalic and atraumatic.  Eyes: Conjunctivae are normal.  Pupils are equal, round, and reactive to light. No scleral icterus.  Neck: Normal range of motion. Neck supple. No tracheal deviation or thyromegaly present.  Cardiovascular: Normal rate, regular rhythm, normal heart sounds and intact distal pulses. Exam reveals no gallop and no friction rub. No murmur heard. Breast: Right breast smaller than left. There is a contour defect in the upper breast at the previous incision. No nipple retraction or nipple discharge is appreciated. No palpable masses are appreciated. No lymphadenopathy is appreciated. Respiratory: Effort normal. No respiratory distress. No chest wall tenderness. Breath sounds normal. No wheezes, rales or rhonchi.  GI: Soft. Bowel sounds are normal. The abdomen is soft and nontender. There is no rebound and no guarding.  Musculoskeletal: Normal range of motion. Extremities are nontender.  Lymphadenopathy: No cervical, preauricular, postauricular or axillary adenopathy is present Skin: Skin is warm and dry. No rash noted. No diaphoresis. No erythema. No pallor. No clubbing, cyanosis, or edema.  Psychiatric: Normal mood and affect. Behavior is normal. Judgment and thought content normal.   Labs None available    Assessment and Plan:   ICD-10-CM  1. Malignant neoplasm of upper-outer quadrant of right breast in female, estrogen receptor positive (CMS/HHS-HCC) C50.411 Ambulatory Referral to Radiation Oncology  Z17.0  2. History of right breast cancer Z85.3 Ambulatory Referral to Radiation Oncology  3. Family history of cancer Z80.9  4. Postoperative breast asymmetry N64.89    Assessment & Plan Malignant neoplasm of upper-outer quadrant of right breast New 3 mm invasive lobular carcinoma, without lymphovascular invasion. New primary malignancy, not recurrence. Age and tumor size favor breast-conserving therapy; lobular histology requires further evaluation for multifocal disease. - Referred to radiation oncology to evaluate adjuvant  radiation therapy necessity, likely omitted due to age and tumor size. - Planned lumpectomy using previous incision for optimal cosmetic outcome. - Ordered preoperative breast MRI. - Ordered CT scan and bone scan for staging and preoperative evaluation. - Arranged radiology placement of seed marker for intraoperative localization. - Discussed anti-hormone therapy; patient reluctant due to prior side effects. - Educated on lumpectomy risks: bleeding, infection, seroma, breast contour changes, rare cardiopulmonary complications, thromboembolism. - Discussed mastectomy alternative; patient prefers lumpectomy. - Referred to specialized bra store for postoperative fitting and prosthesis.  History of right breast cancer Remote history of right breast cancer treated 18 years ago with lumpectomy and radiation. New primary malignancy confirmed, not recurrence. No intervention needed for contralateral breast. No lymph node surgery planned, low lymphedema risk. - Reviewed prior treatment, confirmed new primary malignancy. - No intervention for contralateral (left) breast. - Physical therapy referral unnecessary as no lymph node surgery planned.  Patient offered plastic surgery referral for breast asymmetry, but does not want to pursue that at this time.

## 2024-04-01 ENCOUNTER — Ambulatory Visit (HOSPITAL_COMMUNITY)
Admission: RE | Admit: 2024-04-01 | Discharge: 2024-04-01 | Disposition: A | Discharge status: Home / Self Care | Attending: General Surgery | Admitting: General Surgery

## 2024-04-01 ENCOUNTER — Encounter (HOSPITAL_COMMUNITY): Admission: RE | Disposition: A | Payer: Self-pay | Source: Home / Self Care | Attending: General Surgery

## 2024-04-01 ENCOUNTER — Encounter (HOSPITAL_COMMUNITY): Payer: Self-pay | Admitting: General Surgery

## 2024-04-01 ENCOUNTER — Encounter (HOSPITAL_COMMUNITY): Payer: Self-pay | Admitting: Vascular Surgery

## 2024-04-01 ENCOUNTER — Ambulatory Visit
Admission: RE | Admit: 2024-04-01 | Discharge: 2024-04-01 | Disposition: A | Discharge status: Home / Self Care | Source: Ambulatory Visit | Attending: General Surgery | Admitting: General Surgery

## 2024-04-01 ENCOUNTER — Ambulatory Visit (HOSPITAL_BASED_OUTPATIENT_CLINIC_OR_DEPARTMENT_OTHER): Payer: Self-pay | Admitting: Anesthesiology

## 2024-04-01 DIAGNOSIS — Z803 Family history of malignant neoplasm of breast: Secondary | ICD-10-CM | POA: Insufficient documentation

## 2024-04-01 DIAGNOSIS — Z87891 Personal history of nicotine dependence: Secondary | ICD-10-CM | POA: Diagnosis not present

## 2024-04-01 DIAGNOSIS — C50911 Malignant neoplasm of unspecified site of right female breast: Secondary | ICD-10-CM | POA: Diagnosis not present

## 2024-04-01 DIAGNOSIS — C50411 Malignant neoplasm of upper-outer quadrant of right female breast: Secondary | ICD-10-CM | POA: Insufficient documentation

## 2024-04-01 DIAGNOSIS — Z170 Estrogen receptor positive status [ER+]: Secondary | ICD-10-CM | POA: Insufficient documentation

## 2024-04-01 MED ORDER — LIDOCAINE 2% (20 MG/ML) 5 ML SYRINGE
INTRAMUSCULAR | Status: DC | PRN
  Administered 2024-04-01: 80 mg via INTRAVENOUS

## 2024-04-01 MED ORDER — MIDAZOLAM HCL (PF) 2 MG/2ML IJ SOLN
INTRAMUSCULAR | Status: DC | PRN
  Administered 2024-04-01: 1 mg via INTRAVENOUS

## 2024-04-01 MED ORDER — PHENYLEPHRINE HCL-NACL 20-0.9 MG/250ML-% IV SOLN
INTRAVENOUS | Status: DC | PRN
  Administered 2024-04-01: 20 ug/min via INTRAVENOUS

## 2024-04-01 MED ORDER — CHLORHEXIDINE GLUCONATE 0.12 % MT SOLN
15.0000 mL | Freq: Once | OROMUCOSAL | Status: AC
  Administered 2024-04-01: 15 mL via OROMUCOSAL
  Filled 2024-04-01: qty 15

## 2024-04-01 MED ORDER — PROPOFOL 10 MG/ML IV BOLUS
INTRAVENOUS | Status: DC | PRN
  Administered 2024-04-01: 140 mg via INTRAVENOUS

## 2024-04-01 MED ORDER — DEXMEDETOMIDINE HCL IN NACL 80 MCG/20ML IV SOLN
INTRAVENOUS | Status: AC
  Filled 2024-04-01: qty 20

## 2024-04-01 MED ORDER — EPHEDRINE 5 MG/ML INJ
INTRAVENOUS | Status: AC
  Filled 2024-04-01: qty 5

## 2024-04-01 MED ORDER — FENTANYL CITRATE (PF) 100 MCG/2ML IJ SOLN
INTRAMUSCULAR | Status: AC
  Filled 2024-04-01: qty 2

## 2024-04-01 MED ORDER — DEXAMETHASONE SOD PHOSPHATE PF 10 MG/ML IJ SOLN
INTRAMUSCULAR | Status: DC | PRN
  Administered 2024-04-01: 4 mg via INTRAVENOUS

## 2024-04-01 MED ORDER — ONDANSETRON HCL 4 MG/2ML IJ SOLN
INTRAMUSCULAR | Status: AC
  Filled 2024-04-01: qty 2

## 2024-04-01 MED ORDER — LIDOCAINE HCL (PF) 1 % IJ SOLN
INTRAMUSCULAR | Status: AC
  Filled 2024-04-01: qty 30

## 2024-04-01 MED ORDER — EPHEDRINE SULFATE-NACL 50-0.9 MG/10ML-% IV SOSY
PREFILLED_SYRINGE | INTRAVENOUS | Status: DC | PRN
  Administered 2024-04-01: 5 mg via INTRAVENOUS

## 2024-04-01 MED ORDER — ONDANSETRON HCL 4 MG/2ML IJ SOLN
INTRAMUSCULAR | Status: DC | PRN
  Administered 2024-04-01: 4 mg via INTRAVENOUS

## 2024-04-01 MED ORDER — LACTATED RINGERS IV SOLN: INTRAVENOUS | Status: DC

## 2024-04-01 MED ORDER — MIDAZOLAM HCL 2 MG/2ML IJ SOLN
INTRAMUSCULAR | Status: AC
  Filled 2024-04-01: qty 2

## 2024-04-01 MED ORDER — DEXAMETHASONE SOD PHOSPHATE PF 10 MG/ML IJ SOLN
INTRAMUSCULAR | Status: AC
  Filled 2024-04-01: qty 1

## 2024-04-01 MED ORDER — LIDOCAINE HCL 1 % IJ SOLN
INTRAMUSCULAR | Status: DC | PRN
  Administered 2024-04-01: 60 mL via SURGICAL_CAVITY

## 2024-04-01 MED ORDER — PROPOFOL 10 MG/ML IV BOLUS
INTRAVENOUS | Status: AC
  Filled 2024-04-01: qty 20

## 2024-04-01 MED ORDER — BUPIVACAINE-EPINEPHRINE (PF) 0.25% -1:200000 IJ SOLN
INTRAMUSCULAR | Status: AC
  Filled 2024-04-01: qty 30

## 2024-04-01 MED ORDER — PHENYLEPHRINE 80 MCG/ML (10ML) SYRINGE FOR IV PUSH (FOR BLOOD PRESSURE SUPPORT)
PREFILLED_SYRINGE | INTRAVENOUS | Status: DC | PRN
  Administered 2024-04-01: 80 ug via INTRAVENOUS
  Administered 2024-04-01: 160 ug via INTRAVENOUS
  Administered 2024-04-01: 80 ug via INTRAVENOUS

## 2024-04-01 MED ORDER — FENTANYL CITRATE (PF) 250 MCG/5ML IJ SOLN
INTRAMUSCULAR | Status: DC | PRN
  Administered 2024-04-01 (×2): 50 ug via INTRAVENOUS

## 2024-04-01 MED ORDER — OXYCODONE HCL 5 MG PO TABS: 5.0000 mg | ORAL_TABLET | Freq: Four times a day (QID) | ORAL | 0 refills | Status: AC | PRN

## 2024-04-01 MED ORDER — CHLORHEXIDINE GLUCONATE CLOTH 2 % EX PADS: 6.0000 | MEDICATED_PAD | Freq: Once | CUTANEOUS | Status: DC

## 2024-04-01 MED ORDER — DEXMEDETOMIDINE HCL IN NACL 80 MCG/20ML IV SOLN
INTRAVENOUS | Status: DC | PRN
  Administered 2024-04-01: 4 ug via INTRAVENOUS

## 2024-04-01 MED ORDER — 0.9 % SODIUM CHLORIDE (POUR BTL) OPTIME
TOPICAL | Status: DC | PRN
  Administered 2024-04-01: 1000 mL

## 2024-04-01 MED ORDER — ACETAMINOPHEN 500 MG PO TABS
1000.0000 mg | ORAL_TABLET | ORAL | Status: AC
  Administered 2024-04-01: 1000 mg via ORAL
  Filled 2024-04-01: qty 2

## 2024-04-01 MED ORDER — CEFAZOLIN SODIUM-DEXTROSE 2-4 GM/100ML-% IV SOLN
2.0000 g | INTRAVENOUS | Status: AC
  Administered 2024-04-01: 2 g via INTRAVENOUS
  Filled 2024-04-01: qty 100

## 2024-04-01 MED ORDER — EPHEDRINE SULFATE (PRESSORS) 25 MG/5ML IV SOSY
PREFILLED_SYRINGE | INTRAVENOUS | Status: DC | PRN
  Administered 2024-04-01: 5 mg via INTRAVENOUS

## 2024-04-01 MED ORDER — ORAL CARE MOUTH RINSE: 15.0000 mL | Freq: Once | OROMUCOSAL | Status: AC

## 2024-04-01 NOTE — Op Note (Signed)
 Right Breast Radioactive seed localized lumpectomy  Indications: This patient presents with history of right breast cancer, upper outer quadrant, grade 2 invasive mammary carcinoma, lobular phenotype, cT1aN0, receptors +/+/-. Patient has remote history of right breast cancer 2007 for which she received lumpectomy, SLN bx (2 negative nodes), radiation, and antihormonal tx  Pre-operative Diagnosis: right breast cancer  Post-operative Diagnosis: Same  Surgeon: ARON SHOULDERS   Anesthesia: General endotracheal anesthesia  ASA Class: 2  Procedure Details  The patient was seen in the Holding Room. The risks, benefits, complications, treatment options, and expected outcomes were discussed with the patient. The possibilities of bleeding, infection, the need for additional procedures, failure to diagnose a condition, and creating a complication requiring other procedures or operations were discussed with the patient. The patient concurred with the proposed plan, giving informed consent.  The site of surgery properly noted/marked. The patient was taken to Operating Room # 2, identified, and the procedure verified as right breast seed localized lumpectomy.  The right breast and chest were prepped and draped in standard fashion. A transverse incision was made in her prior incision near the previously placed radioactive seed.  Dissection was carried down around the point of maximum signal intensity. The cautery was used to perform the dissection.   The specimen was inked with the margin marker paint kit.    Specimen radiography confirmed inclusion of the mammographic lesion, the clip, and the seed.  The background signal in the breast was zero. Based on the 3D specimen mammogram, additional inferior margin was taken.   Hemostasis was achieved with cautery.  The cavity was marked with clips on each border other than the anterior border.  The inferior breast tissue was mobilized with the cautery and 2-0 vicryl  mastopexy sutures were used to help bridge the defect.  The wound was irrigated and closed with 3-0 vicryl interrupted deep dermal sutures and 4-0 monocryl running subcuticular suture.      Sterile dressings were applied. At the end of the operation, all sponge, instrument, and needle counts were correct.   Findings: Seed, clip in specimen.  anterior margin is skin, posterior margin is pectoralis   Estimated Blood Loss:  min         Specimens: right breast tissue with seed, additional inferior margin.          Complications:  None; patient tolerated the procedure well.         Disposition: PACU - hemodynamically stable.         Condition: stable

## 2024-04-01 NOTE — Transfer of Care (Signed)
 Immediate Anesthesia Transfer of Care Note  Patient: Alexandra Villa  Procedure(s) Performed: BREAST LUMPECTOMY WITH RADIOACTIVE SEED LOCALIZATION (Right: Breast)  Patient Location: PACU  Anesthesia Type:General  Level of Consciousness: awake, oriented, and patient cooperative  Airway & Oxygen Therapy: Patient Spontanous Breathing and Patient connected to nasal cannula oxygen  Post-op Assessment: Report given to RN and Post -op Vital signs reviewed and stable  Post vital signs: Reviewed and stable  Last Vitals:  Vitals Value Taken Time  BP 153/70 04/01/24 15:01  Temp    Pulse 93 04/01/24 15:04  Resp 23 04/01/24 15:04  SpO2 100 % 04/01/24 15:04  Vitals shown include unfiled device data.  Last Pain:  Vitals:   04/01/24 1221  TempSrc:   PainSc: 0-No pain         Complications: No notable events documented.

## 2024-04-01 NOTE — Discharge Instructions (Addendum)
 [facility] Office Phone Number [phone]  BREAST BIOPSY/ PARTIAL MASTECTOMY: POST OP INSTRUCTIONS  Always review your discharge instruction sheet given to you by the facility where your surgery was performed.  IF YOU HAVE DISABILITY OR FAMILY LEAVE FORMS, YOU MUST BRING THEM TO THE OFFICE FOR PROCESSING.  DO NOT GIVE THEM TO YOUR DOCTOR.  Take 2 tylenol  (acetominophen) three times a day for 3 days.  If you still have pain, add ibuprofen with food in between if able to take this (if you have kidney issues or stomach issues, do not take ibuprofen).  If both of those are not enough, add the narcotic pain pill.  If you find you are needing a lot of this overnight after surgery, call the next morning for a refill.    Prescriptions will not be filled after 5pm or on week-ends. Take your usually prescribed medications unless otherwise directed You should eat very light the first 24 hours after surgery, such as soup, crackers, pudding, etc.  Resume your normal diet the day after surgery. Most patients will experience some swelling and bruising in the breast.  Ice packs and a good support bra will help.  Swelling and bruising can take several days to resolve.  It is common to experience some constipation if taking pain medication after surgery.  Increasing fluid intake and taking a stool softener will usually help or prevent this problem from occurring.  A mild laxative (Milk of Magnesia or Miralax ) should be taken according to package directions if there are no bowel movements after 48 hours. Unless discharge instructions indicate otherwise, you may remove your bandages 48 hours after surgery, and you may shower at that time.  You may have steri-strips (small skin tapes) in place directly over the incision.  These strips should be left on the skin at least for for 7-10 days.    ACTIVITIES:  You may resume regular daily activities (gradually increasing) beginning the next day.  Wearing a  good support bra or sports bra (or the breast binder) minimizes pain and swelling.  You may have sexual intercourse when it is comfortable. No heavy lifting for 1-2 weeks (not over around 10 pounds).  You may drive when you no longer are taking prescription pain medication, you can comfortably wear a seatbelt, and you can safely maneuver your car and apply brakes. RETURN TO WORK:  __________3-14 days depending on job. _______________ Alexandra Villa should see your doctor in the office for a follow-up appointment approximately two weeks after your surgery.  Your doctor's nurse will typically make your follow-up appointment when she calls you with your pathology report.  Expect your pathology report 3-4 business days after your surgery.  You may call to check if you do not hear from us  after three days.   WHEN TO CALL YOUR DOCTOR: Fever over 101.0 Nausea and/or vomiting. Extreme swelling or bruising. Continued bleeding from incision. Increased pain, redness, or drainage from the incision.  The clinic staff is available to answer your questions during regular business hours.  Please don't hesitate to call and ask to speak to one of the nurses for clinical concerns.  If you have a medical emergency, go to the nearest emergency room or call 911.  A surgeon from [facility] is always on call at the hospital.  For further questions, please visit [email or web address]

## 2024-04-01 NOTE — Anesthesia Procedure Notes (Signed)
 Procedure Name: LMA Insertion Date/Time: 04/01/2024 1:52 PM  Performed by: Erick Fitz, CRNAPre-anesthesia Checklist: Patient identified, Emergency Drugs available, Suction available, Patient being monitored and Timeout performed Patient Re-evaluated:Patient Re-evaluated prior to induction Oxygen Delivery Method: Circle system utilized Preoxygenation: Pre-oxygenation with 100% oxygen Induction Type: IV induction Ventilation: Mask ventilation without difficulty LMA: LMA inserted LMA Size: 4.0 Number of attempts: 1 Placement Confirmation: positive ETCO2, CO2 detector and breath sounds checked- equal and bilateral Tube secured with: Tape (secured with pink Hy-tape) Dental Injury: Teeth and Oropharynx as per pre-operative assessment

## 2024-04-01 NOTE — Interval H&P Note (Signed)
 History and Physical Interval Note:  04/01/2024 12:20 PM  Alexandra Villa  has presented today for surgery, with the diagnosis of RIGHT BREAST CANCER.  The various methods of treatment have been discussed with the patient and family. After consideration of risks, benefits and other options for treatment, the patient has consented to  Procedures with comments: BREAST LUMPECTOMY WITH RADIOACTIVE SEED LOCALIZATION (Right) - RIGHT BREAST SEED-LOCALIZED LUMPECTOMY as a surgical intervention.  The patient's history has been reviewed, patient examined, no change in status, stable for surgery.  I have reviewed the patient's chart and labs.  Questions were answered to the patient's satisfaction.     Jina Nephew

## 2024-04-01 NOTE — Anesthesia Postprocedure Evaluation (Signed)
"   Anesthesia Post Note  Patient: Alexandra Villa  Procedure(s) Performed: BREAST LUMPECTOMY WITH RADIOACTIVE SEED LOCALIZATION (Right: Breast)     Patient location during evaluation: PACU Anesthesia Type: General Level of consciousness: awake and alert Pain management: pain level controlled Vital Signs Assessment: post-procedure vital signs reviewed and stable Respiratory status: spontaneous breathing, nonlabored ventilation and respiratory function stable Cardiovascular status: blood pressure returned to baseline and stable Postop Assessment: no apparent nausea or vomiting Anesthetic complications: no   No notable events documented.  Last Vitals:  Vitals:   04/01/24 1530 04/01/24 1545  BP: (!) 143/61 (!) 155/83  Pulse: 76 84  Resp: 18 20  Temp:  36.5 C  SpO2: 98% 97%    Last Pain:  Vitals:   04/01/24 1545  TempSrc:   PainSc: 0-No pain                 Garnette FORBES Skillern      "

## 2024-04-02 ENCOUNTER — Encounter (HOSPITAL_COMMUNITY): Payer: Self-pay | Admitting: General Surgery

## 2024-04-06 ENCOUNTER — Ambulatory Visit: Payer: Self-pay | Admitting: General Surgery

## 2024-04-06 ENCOUNTER — Encounter

## 2024-04-06 LAB — SURGICAL PATHOLOGY

## 2024-04-07 ENCOUNTER — Encounter

## 2024-04-22 ENCOUNTER — Inpatient Hospital Stay: Attending: Hematology and Oncology | Admitting: Hematology and Oncology
